# Patient Record
Sex: Male | Born: 1960 | Race: White | Hispanic: No | Marital: Single | Smoking: Former smoker
Health system: Southern US, Community
[De-identification: ages and names within clinical notes are randomized; demographics above are authoritative.]

## PROBLEM LIST (undated history)

## (undated) DIAGNOSIS — H269 Unspecified cataract: Secondary | ICD-10-CM

## (undated) DIAGNOSIS — E785 Hyperlipidemia, unspecified: Secondary | ICD-10-CM

## (undated) DIAGNOSIS — I255 Ischemic cardiomyopathy: Secondary | ICD-10-CM

## (undated) DIAGNOSIS — E119 Type 2 diabetes mellitus without complications: Secondary | ICD-10-CM

## (undated) DIAGNOSIS — I119 Hypertensive heart disease without heart failure: Secondary | ICD-10-CM

## (undated) DIAGNOSIS — I10 Essential (primary) hypertension: Secondary | ICD-10-CM

## (undated) DIAGNOSIS — I2510 Atherosclerotic heart disease of native coronary artery without angina pectoris: Secondary | ICD-10-CM

## (undated) DIAGNOSIS — N529 Male erectile dysfunction, unspecified: Secondary | ICD-10-CM

## (undated) DIAGNOSIS — R011 Cardiac murmur, unspecified: Secondary | ICD-10-CM

## (undated) HISTORY — DX: Type 2 diabetes mellitus without complications: E11.9

## (undated) HISTORY — DX: Atherosclerotic heart disease of native coronary artery without angina pectoris: I25.10

## (undated) HISTORY — DX: Hyperlipidemia, unspecified: E78.5

## (undated) HISTORY — DX: Morbid (severe) obesity due to excess calories: E66.01

## (undated) HISTORY — DX: Unspecified cataract: H26.9

## (undated) HISTORY — DX: Cardiac murmur, unspecified: R01.1

## (undated) HISTORY — DX: Hypertensive heart disease without heart failure: I11.9

## (undated) HISTORY — DX: Ischemic cardiomyopathy: I25.5

## (undated) HISTORY — DX: Essential (primary) hypertension: I10

## (undated) HISTORY — DX: Male erectile dysfunction, unspecified: N52.9

## (undated) HISTORY — PX: GASTROSTOMY W/ FEEDING TUBE: SUR642

---

## 2010-11-28 HISTORY — PX: CARDIAC CATHETERIZATION: SHX172

## 2010-12-20 ENCOUNTER — Inpatient Hospital Stay: Payer: Self-pay | Admitting: Internal Medicine

## 2010-12-20 ENCOUNTER — Encounter: Payer: Self-pay | Admitting: Cardiovascular Disease

## 2010-12-20 DIAGNOSIS — I2510 Atherosclerotic heart disease of native coronary artery without angina pectoris: Secondary | ICD-10-CM

## 2010-12-20 DIAGNOSIS — M279 Disease of jaws, unspecified: Secondary | ICD-10-CM

## 2010-12-21 DIAGNOSIS — I214 Non-ST elevation (NSTEMI) myocardial infarction: Secondary | ICD-10-CM

## 2011-01-18 ENCOUNTER — Ambulatory Visit: Payer: Self-pay | Admitting: Internal Medicine

## 2011-01-21 ENCOUNTER — Telehealth: Payer: Self-pay | Admitting: Internal Medicine

## 2011-01-21 NOTE — Telephone Encounter (Signed)
Spoke with patient. Per patient he is going to call his cardiologist, Dr. Mariah Milling.

## 2011-01-29 ENCOUNTER — Ambulatory Visit: Payer: Self-pay | Admitting: Internal Medicine

## 2011-02-28 ENCOUNTER — Ambulatory Visit: Payer: Self-pay | Admitting: Internal Medicine

## 2011-03-31 ENCOUNTER — Ambulatory Visit: Payer: Self-pay | Admitting: Internal Medicine

## 2011-04-12 ENCOUNTER — Other Ambulatory Visit: Payer: Self-pay | Admitting: Internal Medicine

## 2011-04-13 MED ORDER — METOPROLOL TARTRATE 25 MG PO TABS: 25.0000 mg | ORAL_TABLET | Freq: Two times a day (BID) | ORAL | Status: DC

## 2011-04-13 MED ORDER — METFORMIN HCL 500 MG PO TABS: 500.0000 mg | ORAL_TABLET | Freq: Two times a day (BID) | ORAL | Status: DC

## 2011-04-13 MED ORDER — ATORVASTATIN CALCIUM 40 MG PO TABS: 40.0000 mg | ORAL_TABLET | Freq: Every day | ORAL | Status: DC

## 2011-04-13 MED ORDER — GLIPIZIDE 5 MG PO TABS: 5.0000 mg | ORAL_TABLET | Freq: Two times a day (BID) | ORAL | Status: DC

## 2011-04-13 MED ORDER — CLOPIDOGREL BISULFATE 75 MG PO TABS: 75.0000 mg | ORAL_TABLET | Freq: Every day | ORAL | Status: DC

## 2011-04-13 MED ORDER — ENALAPRIL MALEATE 2.5 MG PO TABS: 2.5000 mg | ORAL_TABLET | Freq: Every day | ORAL | Status: DC

## 2011-04-13 NOTE — Telephone Encounter (Signed)
Ok to refill these 6 meds, but please make sure patient has made an appt to followup within the next 30 days

## 2011-08-14 LAB — HM DIABETES EYE EXAM: HM Diabetic Eye Exam: NORMAL

## 2011-09-08 ENCOUNTER — Encounter: Payer: Self-pay | Admitting: Internal Medicine

## 2011-09-08 ENCOUNTER — Ambulatory Visit (INDEPENDENT_AMBULATORY_CARE_PROVIDER_SITE_OTHER): Payer: BC Managed Care – PPO | Admitting: Internal Medicine

## 2011-09-08 VITALS — BP 134/70 | HR 56 | Temp 98.3°F | Resp 16 | Ht 71.5 in | Wt 252.5 lb

## 2011-09-08 DIAGNOSIS — E119 Type 2 diabetes mellitus without complications: Secondary | ICD-10-CM

## 2011-09-08 DIAGNOSIS — E1165 Type 2 diabetes mellitus with hyperglycemia: Secondary | ICD-10-CM

## 2011-09-08 DIAGNOSIS — E785 Hyperlipidemia, unspecified: Secondary | ICD-10-CM

## 2011-09-08 DIAGNOSIS — Z79899 Other long term (current) drug therapy: Secondary | ICD-10-CM

## 2011-09-08 DIAGNOSIS — E669 Obesity, unspecified: Secondary | ICD-10-CM

## 2011-09-08 DIAGNOSIS — I25118 Atherosclerotic heart disease of native coronary artery with other forms of angina pectoris: Secondary | ICD-10-CM | POA: Insufficient documentation

## 2011-09-08 DIAGNOSIS — I2510 Atherosclerotic heart disease of native coronary artery without angina pectoris: Secondary | ICD-10-CM | POA: Insufficient documentation

## 2011-09-08 DIAGNOSIS — Z125 Encounter for screening for malignant neoplasm of prostate: Secondary | ICD-10-CM

## 2011-09-08 NOTE — Progress Notes (Signed)
Patient ID: Todd Jenkins, male   DOB: [birth year], 51 y.o.   MRN: [id number]   Patient Active Problem List  Diagnoses  . Coronary artery disease  . Type 2 diabetes mellitus, controlled, with renal complications  . Hyperlipidemia LDL goal < 70    Subjective:  CC:   Chief Complaint  Patient presents with  . Follow-up    HPI:   Todd Jenkins a 51 y.o. male who presents for management of diabetes, hyperlipidemia, CAD and tobacco abuse.  He has been seen since Dr. Darrick Huntsman left [facility] and joined [facility] prior to July 2012) and has not had any medical care since then.  He reports fatigue,  Is exercising less , but taking his medications,  He has occasional loose bowel movements but denies incontinence or nausea. Hre is not exercising regularly and not following a carbohydrate restricted diet.    Past Medical History  Diagnosis Date  . Coronary artery disease       . H/O non-ST elevation myocardial infarction (NSTEMI) July 2012    s/p PTCA/ DESstent of 100% occluded LAD    History reviewed. No pertinent past surgical history.       The following portions of the patient's history were reviewed and updated as appropriate: Allergies, current medications, and problem list.    Review of Systems:   12 Pt  review of systems was negative except those addressed in the HPI,     History   Social History  . Marital Status: Single    Spouse Name: N/A    Number of Children: N/A  . Years of Education: N/A   Occupational History  . Not on file.   Social History Main Topics  . Smoking status: Former Smoker    Types: Cigars  . Smokeless tobacco: Never Used  . Alcohol Use: Yes  . Drug Use: No  . Sexually Active: Not on file   Other Topics Concern  . Not on file   Social History Narrative  . No narrative on file    Objective:  BP 134/70  Pulse 56  Temp(Src) 98.3 F (36.8 C) (Oral)  Resp 16  Ht 5' 11.5" (1.816 m)  Wt 252 lb 8 oz  (114.533 kg)  BMI 34.73 kg/m2  SpO2 99%  General appearance: alert, cooperative and appears stated age Ears: normal TM's and external ear canals both ears Throat: lips, mucosa, and tongue normal; teeth and gums normal Neck: no adenopathy, no carotid bruit, supple, symmetrical, trachea midline and thyroid not enlarged, symmetric, no tenderness/mass/nodules Back: symmetric, no curvature. ROM normal. No CVA tenderness. Lungs: clear to auscultation bilaterally Heart: regular rate and rhythm, S1, S2 normal, no murmur, click, rub or gallop Abdomen: soft, non-tender; bowel sounds normal; no masses,  no organomegaly Pulses: 2+ and symmetric Skin: Skin color, texture, turgor normal. No rashes or lesions Lymph nodes: Cervical, supraclavicular, and axillary nodes normal.  Assessment and Plan:  Coronary artery disease S/p DES of 100% occluded LAD July 2012 for NSTEMI.  He has been lost to followup since his admission and will be scheduled as be referred to Dr. Mariah Milling .  Type 2 diabetes mellitus, controlled, with renal complications And he has not had a hemoglobin A1c since before July 2012. He does not check blood sugars regularly. This is her labs have been ordered today. He has been reminded that he needs an annual eye examcoFortunately his hemoglobin A1c is 6.1 today and his LDL is 69 on current medications ,  and he has normal kidney and liver function. We spent considerable  time addressing his obesity concurrent medical diagnoses and need for low glycemic diet.   Hyperlipidemia LDL goal < 70 Well-controlled on Lipitor per current labs. No changes today    Updated Medication List Outpatient Encounter Prescriptions as of 09/08/2011  Medication Sig Dispense Refill  . aspirin 325 MG tablet Take 325 mg by mouth daily.      Marland Kitchen atorvastatin (LIPITOR) 40 MG tablet Take 1 tablet (40 mg total) by mouth daily.  30 tablet  5  . clopidogrel (PLAVIX) 75 MG tablet Take 1 tablet (75 mg total) by mouth  daily.  30 tablet  5  . enalapril (VASOTEC) 2.5 MG tablet Take 1 tablet (2.5 mg total) by mouth daily.  30 tablet  5  . glipiZIDE (GLUCOTROL) 5 MG tablet Take 1 tablet (5 mg total) by mouth 2 (two) times daily.  60 tablet  5  . metFORMIN (GLUCOPHAGE) 500 MG tablet Take 1 tablet (500 mg total) by mouth 2 (two) times daily with a meal.  60 tablet  5  . metoprolol tartrate (LOPRESSOR) 25 MG tablet Take 1 tablet (25 mg total) by mouth 2 (two) times daily.  60 tablet  5     Orders Placed This Encounter  Procedures  . MyChart Weight Flowsheet  . PSA, total and free  . Lipid panel  . COMPLETE METABOLIC PANEL WITH GFR  . Hemoglobin A1c  . Microalbumin / creatinine urine ratio  . CBC with Differential  . Ambulatory referral to Cardiology    Return in about 3 months (around 12/08/2011).       And in an and is

## 2011-09-08 NOTE — Patient Instructions (Signed)
Consider the Low Glycemic Index Diet and 6 smaller meals daily :   7 AM Low carbohydrate Protein  Shakes (EAS Carb Control  Or Atkins ,  Available everywhere,   In  cases at [employer] )  2.5 carbs  (Add or substitute a toasted sandwhich thin w/ peanut butter)  10 AM: Protein bar by Atkins (snack size,  Chocolate lover's variety at  [employer])    Lunch: sandwich on pita bread or flatbread (Joseph's makes a pita bread and a flat bread , available at [employer] and [employer]; Toufayah makes a low carb flatbread available at [employer] and [employer]) [employer] and [employer] make a low carb whole wheat tortilla  3 PM:  Mid day :  Another protein bar,  Or a  cheese stick, 1/4 cup of almonds, walnuts, pistachios, pecans, peanuts,  Macadamia nuts  6 PM  Dinner:  "mean and green:"  Meat/chicken/fish, salad, and green veggie : use ranch, vinagrette,  Blue cheese, etc  9 PM snack : Breyer's low carb fudgiscle or  ice cream bar (Carb Smart) Weight Watcher's ice cream bar , or another protein shake

## 2011-09-09 ENCOUNTER — Other Ambulatory Visit (INDEPENDENT_AMBULATORY_CARE_PROVIDER_SITE_OTHER): Payer: BC Managed Care – PPO | Admitting: *Deleted

## 2011-09-09 DIAGNOSIS — E785 Hyperlipidemia, unspecified: Secondary | ICD-10-CM

## 2011-09-09 DIAGNOSIS — E119 Type 2 diabetes mellitus without complications: Secondary | ICD-10-CM

## 2011-09-09 DIAGNOSIS — Z125 Encounter for screening for malignant neoplasm of prostate: Secondary | ICD-10-CM

## 2011-09-09 DIAGNOSIS — Z79899 Other long term (current) drug therapy: Secondary | ICD-10-CM

## 2011-09-09 LAB — CBC WITH DIFFERENTIAL/PLATELET
Basophils Absolute: 0 10*3/uL (ref 0.0–0.1)
Eosinophils Absolute: 0.2 10*3/uL (ref 0.0–0.7)
MCHC: 34.1 g/dL (ref 30.0–36.0)
MCV: 87.5 fl (ref 78.0–100.0)
Monocytes Absolute: 0.5 10*3/uL (ref 0.1–1.0)
Neutrophils Relative %: 66 % (ref 43.0–77.0)
Platelets: 188 10*3/uL (ref 150.0–400.0)
WBC: 6.6 10*3/uL (ref 4.5–10.5)

## 2011-09-09 LAB — LIPID PANEL: HDL: 47.8 mg/dL (ref >39.00)

## 2011-09-10 LAB — PSA, TOTAL AND FREE
PSA, Free Pct: 28 % (ref >25)
PSA, Free: 0.15 ng/mL
PSA: 0.53 ng/mL (ref <=4.00)

## 2011-09-10 LAB — COMPLETE METABOLIC PANEL WITH GFR
ALT: 28 U/L (ref 0–53)
CO2: 21 mEq/L (ref 19–32)
Calcium: 9.1 mg/dL (ref 8.4–10.5)
Chloride: 107 mEq/L (ref 96–112)
Creat: 0.96 mg/dL (ref 0.50–1.35)
GFR, Est African American: >89 mL/min
Glucose, Bld: 138 mg/dL — ABNORMAL HIGH (ref 70–99)
Total Protein: 6.7 g/dL (ref 6.0–8.3)

## 2011-09-11 ENCOUNTER — Encounter: Payer: Self-pay | Admitting: Internal Medicine

## 2011-09-11 DIAGNOSIS — E1129 Type 2 diabetes mellitus with other diabetic kidney complication: Secondary | ICD-10-CM | POA: Insufficient documentation

## 2011-09-11 DIAGNOSIS — E785 Hyperlipidemia, unspecified: Secondary | ICD-10-CM | POA: Insufficient documentation

## 2011-09-11 NOTE — Assessment & Plan Note (Signed)
S/p DES of 100% occluded LAD July 2012 for NSTEMI.  He has been lost to followup since his admission and will be scheduled as be referred to Dr. Mariah Milling .

## 2011-09-11 NOTE — Assessment & Plan Note (Signed)
Well-controlled on Lipitor per current labs. No changes today

## 2011-09-11 NOTE — Assessment & Plan Note (Addendum)
And he has not had a hemoglobin A1c since before July 2012. He does not check blood sugars regularly. Ad hsi annual diabetic eye exam at [facility] in March 2013. Fortunately his hemoglobin A1c is 6.1 today and his LDL is 69 on current medications , and he has normal kidney and liver function. We spent considerable  time addressing his obesity given his  concurrent medical diagnoses and need for low glycemic diet.

## 2011-10-04 ENCOUNTER — Ambulatory Visit: Payer: BC Managed Care – PPO | Admitting: Cardiovascular Disease

## 2011-10-18 ENCOUNTER — Other Ambulatory Visit: Payer: Self-pay | Admitting: Internal Medicine

## 2011-10-18 MED ORDER — METOPROLOL TARTRATE 25 MG PO TABS: 25.0000 mg | ORAL_TABLET | Freq: Two times a day (BID) | ORAL | Status: DC

## 2011-10-18 MED ORDER — ENALAPRIL MALEATE 2.5 MG PO TABS: 2.5000 mg | ORAL_TABLET | Freq: Every day | ORAL | Status: DC

## 2011-10-18 MED ORDER — GLIPIZIDE 5 MG PO TABS: 5.0000 mg | ORAL_TABLET | Freq: Two times a day (BID) | ORAL | Status: DC

## 2011-10-18 MED ORDER — ATORVASTATIN CALCIUM 40 MG PO TABS: 40.0000 mg | ORAL_TABLET | Freq: Every day | ORAL | Status: DC

## 2011-10-18 MED ORDER — METFORMIN HCL 500 MG PO TABS: 500.0000 mg | ORAL_TABLET | Freq: Two times a day (BID) | ORAL | Status: DC

## 2011-10-18 MED ORDER — CLOPIDOGREL BISULFATE 75 MG PO TABS: 75.0000 mg | ORAL_TABLET | Freq: Every day | ORAL | Status: DC

## 2011-10-20 ENCOUNTER — Encounter: Payer: Self-pay | Admitting: Cardiovascular Disease

## 2011-10-20 ENCOUNTER — Other Ambulatory Visit: Payer: Self-pay | Admitting: Internal Medicine

## 2011-10-20 ENCOUNTER — Ambulatory Visit (INDEPENDENT_AMBULATORY_CARE_PROVIDER_SITE_OTHER): Payer: BC Managed Care – PPO | Admitting: Cardiovascular Disease

## 2011-10-20 VITALS — BP 148/80 | HR 74 | Ht 71.0 in | Wt 251.0 lb

## 2011-10-20 DIAGNOSIS — E785 Hyperlipidemia, unspecified: Secondary | ICD-10-CM

## 2011-10-20 DIAGNOSIS — E118 Type 2 diabetes mellitus with unspecified complications: Secondary | ICD-10-CM

## 2011-10-20 DIAGNOSIS — I2510 Atherosclerotic heart disease of native coronary artery without angina pectoris: Secondary | ICD-10-CM

## 2011-10-20 MED ORDER — ENALAPRIL MALEATE 2.5 MG PO TABS: 2.5000 mg | ORAL_TABLET | Freq: Two times a day (BID) | ORAL | Status: DC

## 2011-10-20 MED ORDER — GLIPIZIDE 5 MG PO TABS: 5.0000 mg | ORAL_TABLET | Freq: Two times a day (BID) | ORAL | Status: DC

## 2011-10-20 NOTE — Patient Instructions (Signed)
You are doing well. Please increase enalapril to 2.5 mg twice a day decrease the aspirin to 81 mg x 2 with plavix  Please call us if you have new issues that need to be addressed before your next appt.  Your physician wants you to follow-up in: 6 months.  You will receive a reminder letter in the mail two months in advance. If you don't receive a letter, please call our office to schedule the follow-up appointment.

## 2011-10-20 NOTE — Assessment & Plan Note (Signed)
Cholesterol is at goal on the current lipid regimen. No changes to the medications were made.  

## 2011-10-20 NOTE — Assessment & Plan Note (Signed)
We have encouraged continued exercise, careful diet management in an effort to lose weight. Hemoglobin A1c is well-controlled.

## 2011-10-20 NOTE — Progress Notes (Signed)
Patient ID: Todd Jenkins, male    DOB: 02-04-1961, 51 y.o.   MRN: [id number]  HPI Comments: Todd Jenkins is a 51 y.o. male with a history of diabetes, hyperlipidemia, CAD and tobacco abuse.  H/O non-ST elevation myocardial infarction (NSTEMI)  July 2012   s/p PTCA/ DESstent of 100% occluded LAD. He was lost to followup over the course of the past year and presents today to establish care in the office. Previous notes indicated moderate to heavy alcohol use. He reports having 2 drinks per day currently  He reports that he's been feeling well. No chest pain, jaw pain, diaphoresis. Reasonable exercise tolerance. No significant shortness of breath with exertion. He has been tolerating his medications without any difficulty.  Previous echocardiogram July 2012 showed ejection fraction 40-45%, moderate anterior wall hypokinesis, apical wall hypokinesis consistent with old anterior MI  Cardiac catheterization showed 99% mid LAD disease after the B1 vessel, ejection fraction 30-35%. Successful  xience 3.0 x 18 mm DES stent placed  EKG shows sinus bradycardia with rate 54 beats per minute with old anterior septal MI     Outpatient Encounter Prescriptions as of 10/20/2011  Medication Sig Dispense Refill  . aspirin 325 MG tablet Take 325 mg by mouth daily.      Marland Kitchen atorvastatin (LIPITOR) 40 MG tablet Take 1 tablet (40 mg total) by mouth daily.  30 tablet  5  . clopidogrel (PLAVIX) 75 MG tablet Take 1 tablet (75 mg total) by mouth daily.  30 tablet  5  . enalapril (VASOTEC) 2.5 MG tablet Take 1 tablet (2.5 mg total) by mouth 2 (two) times daily.  60 tablet  6  . metFORMIN (GLUCOPHAGE) 500 MG tablet Take 1 tablet (500 mg total) by mouth 2 (two) times daily with a meal.  60 tablet  5  . metoprolol tartrate (LOPRESSOR) 25 MG tablet Take 1 tablet (25 mg total) by mouth 2 (two) times daily.  60 tablet  5   Review of Systems  Constitutional: Negative.   HENT: Negative.   Eyes: Negative.    Respiratory: Negative.   Cardiovascular: Negative.   Gastrointestinal: Negative.   Musculoskeletal: Negative.   Skin: Negative.   Neurological: Negative.   Hematological: Negative.   Psychiatric/Behavioral: Negative.   All other systems reviewed and are negative.    BP 148/80  Pulse 74  Ht 5\' 11"  (1.803 m)  Wt 251 lb (113.853 kg)  BMI 35.01 kg/m2  Physical Exam  Nursing note and vitals reviewed. Constitutional: He is oriented to person, place, and time. He appears well-developed and well-nourished.  HENT:  Head: Normocephalic.  Nose: Nose normal.  Mouth/Throat: Oropharynx is clear and moist.  Eyes: Conjunctivae are normal. Pupils are equal, round, and reactive to light.  Neck: Normal range of motion. Neck supple. No JVD present.  Cardiovascular: Normal rate, regular rhythm, S1 normal, S2 normal, normal heart sounds and intact distal pulses.  Exam reveals no gallop and no friction rub.   No murmur heard. Pulmonary/Chest: Effort normal and breath sounds normal. No respiratory distress. He has no wheezes. He has no rales. He exhibits no tenderness.  Abdominal: Soft. Bowel sounds are normal. He exhibits no distension. There is no tenderness.  Musculoskeletal: Normal range of motion. He exhibits no edema and no tenderness.  Lymphadenopathy:    He has no cervical adenopathy.  Neurological: He is alert and oriented to person, place, and time. Coordination normal.  Skin: Skin is warm and dry. No rash noted. No  erythema.  Psychiatric: He has a normal mood and affect. His behavior is normal. Judgment and thought content normal.           Assessment and Plan

## 2011-10-20 NOTE — Assessment & Plan Note (Signed)
Currently with no symptoms of angina. No further workup at this time. Continue current medication regimen. 

## 2011-12-08 ENCOUNTER — Ambulatory Visit (INDEPENDENT_AMBULATORY_CARE_PROVIDER_SITE_OTHER): Payer: BC Managed Care – PPO | Admitting: Internal Medicine

## 2011-12-08 ENCOUNTER — Encounter: Payer: Self-pay | Admitting: Internal Medicine

## 2011-12-08 VITALS — BP 120/70 | HR 60 | Temp 98.5°F | Resp 16 | Ht 71.5 in | Wt 254.8 lb

## 2011-12-08 DIAGNOSIS — E119 Type 2 diabetes mellitus without complications: Secondary | ICD-10-CM

## 2011-12-08 DIAGNOSIS — R5381 Other malaise: Secondary | ICD-10-CM

## 2011-12-08 DIAGNOSIS — L989 Disorder of the skin and subcutaneous tissue, unspecified: Secondary | ICD-10-CM

## 2011-12-08 DIAGNOSIS — E669 Obesity, unspecified: Secondary | ICD-10-CM

## 2011-12-08 DIAGNOSIS — R5383 Other fatigue: Secondary | ICD-10-CM

## 2011-12-08 DIAGNOSIS — J069 Acute upper respiratory infection, unspecified: Secondary | ICD-10-CM

## 2011-12-08 DIAGNOSIS — Z0000 Encounter for general adult medical examination without abnormal findings: Secondary | ICD-10-CM

## 2011-12-08 DIAGNOSIS — Z125 Encounter for screening for malignant neoplasm of prostate: Secondary | ICD-10-CM

## 2011-12-08 DIAGNOSIS — E118 Type 2 diabetes mellitus with unspecified complications: Secondary | ICD-10-CM

## 2011-12-08 LAB — HEMOGLOBIN A1C: Hgb A1c MFr Bld: 6.5 % (ref 4.6–6.5)

## 2011-12-08 LAB — POCT URINALYSIS DIPSTICK
Blood, UA: NEGATIVE
Glucose, UA: 100
Leukocytes, UA: NEGATIVE
Nitrite, UA: NEGATIVE
Urobilinogen, UA: 0.2
pH, UA: 5.5

## 2011-12-08 LAB — COMPLETE METABOLIC PANEL WITH GFR
AST: 21 U/L (ref 0–37)
Albumin: 3.9 g/dL (ref 3.5–5.2)
BUN: 18 mg/dL (ref 6–23)
CO2: 24 mEq/L (ref 19–32)
Calcium: 9.2 mg/dL (ref 8.4–10.5)
Chloride: 106 mEq/L (ref 96–112)
Creat: 0.93 mg/dL (ref 0.50–1.35)
GFR, Est African American: >89 mL/min
Glucose, Bld: 142 mg/dL — ABNORMAL HIGH (ref 70–99)
Potassium: 5 mEq/L (ref 3.5–5.3)

## 2011-12-08 LAB — TSH: TSH: 2.42 u[IU]/mL (ref 0.35–5.50)

## 2011-12-08 LAB — MICROALBUMIN / CREATININE URINE RATIO: Microalb Creat Ratio: 1.2 mg/g (ref 0.0–30.0)

## 2011-12-08 LAB — PSA: PSA: 0.49 ng/mL (ref 0.10–4.00)

## 2011-12-08 MED ORDER — AMOXICILLIN-POT CLAVULANATE 875-125 MG PO TABS: 1.0000 | ORAL_TABLET | Freq: Two times a day (BID) | ORAL | Status: AC

## 2011-12-08 NOTE — Patient Instructions (Signed)
gargle with salt water  Snort Simply saline twice daily for a few days to clear the post nasal drip     augmentin twice daily for 7 days

## 2011-12-08 NOTE — Assessment & Plan Note (Addendum)
With CAD. Well controlled,  hgba1c 6.5  Up to date on annual eye exam with Dr Karren Cobble office.  Foot exam normal today . No proteinuria . LDL 69 by April labs

## 2011-12-08 NOTE — Assessment & Plan Note (Signed)
Annual exam done today including skin check.  One lesion on left posterior shoulder is concernign for skin CA and will be referred to Dr. Adolphus Birchwood for biopsy.

## 2011-12-08 NOTE — Progress Notes (Signed)
Patient ID: Todd Jenkins, male   DOB: 1960-09-29, 51 y.o.   MRN: [id number]  Patient Active Problem List  Diagnosis  . Coronary artery disease  . Diabetes mellitus type 2 with complications  . Hyperlipidemia LDL goal < 70  . URI (upper respiratory infection)  . Fatigue  . Special screening for malignant neoplasm of prostate  . Routine general medical examination at a health care facility  . Skin lesion of left arm  . Obesity (BMI 30-39.9)    Subjective:  CC:   Chief Complaint  Patient presents with  . Annual Exam    HPI:   Todd Jenkins a 51 y.o. male who presents for his annual exam, but has a new complaint of feeling foggy headed on a daily basis.  His symptoms are aggravated by hunger, but he has found no evidence of hypoglycemia by accu checks done at home. He has recently increased his metoprolol to twice daily after  Dr. Mariah Milling noted incorrect use (he swears that the directions on his bottles previously directed once daily use but the rx in [facility] since November states twice daily...)   He does snore, and typically feels tired in the morning but is not  falling asleep during the day. He denies headaches, vertigo and impotence.  No unintentional wt loss or wt gain.    Past Medical History  Diagnosis Date  . Coronary artery disease   . H/O non-ST elevation myocardial infarction (NSTEMI) July 2012    s/p PTCA/ DESstent of 100% occluded LAD  . Diabetes mellitus     type II    Past Surgical History  Procedure Date  . Cardiac catheterization July 2012  . 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    Review of Systems:   Review of Systems  Constitutional: Positive for malaise/fatigue. Negative for fever and diaphoresis.  HENT: Negative.  Negative for ear pain and congestion.    Eyes: Negative.   Cardiovascular: Negative for chest pain, orthopnea and PND.  Gastrointestinal: Positive for abdominal pain. Negative for heartburn, nausea and blood in stool.  Genitourinary: Negative for dysuria and flank pain.  Musculoskeletal: Negative for myalgias.  Skin: Negative.   Neurological: Negative for dizziness, sensory change, focal weakness, loss of consciousness and headaches.  Psychiatric/Behavioral: Negative for memory loss. The patient does not have insomnia.         History   Social History  . Marital Status: Single    Spouse Name: N/A    Number of Children: N/A  . Years of Education: N/A   Occupational History  . Not on file.   Social History Main Topics  . Smoking status: Former Smoker -- 8 years    Types: Cigars  . Smokeless tobacco: Never Used  . Alcohol Use: No  . Drug Use: No  . Sexually Active: Not on file   Other Topics Concern  . Not on file   Social History Narrative  . No narrative on file    Objective:  BP 120/70  Pulse 60  Temp 98.5 F (36.9 C) (Oral)  Resp 16  Ht 5' 11.5" (1.816 m)  Wt 254 lb 12 oz (115.554 kg)  BMI 35.04 kg/m2  SpO2 96%  General Appearance:    Obese, Alert, cooperative, no distress,  appears stated age  Head:    Normocephalic, without obvious abnormality, atraumatic  Eyes:    PERRL, conjunctiva/corneas clear, EOM's intact, fundi    benign, both eyes       Ears:    Normal TM's and external ear canals, both ears  Nose:   Nares normal, septum midline, mucosa normal, no drainage   or sinus tenderness  Throat:   Lips, mucosa, and tongue normal; teeth and gums normal  Neck:   Supple, symmetrical, trachea midline, no adenopathy;       thyroid:  No enlargement/tenderness/nodules; no carotid   bruit or JVD  Back:     Symmetric, no curvature, ROM normal, no CVA tenderness  Lungs:     Clear to auscultation bilaterally, respirations unlabored  Chest wall:    No tenderness or deformity  Heart:    Regular rate  and rhythm, S1 and S2 normal, no murmur, rub   or gallop  Abdomen:     Soft, non-tender, bowel sounds active all four quadrants,    no masses, no organomegaly  Genitalia:    Normal male without lesion, discharge or tenderness  Rectal:    Normal tone, normal prostate, no masses or tenderness;   guaiac negative stool  Extremities:   Extremities normal, atraumatic, no cyanosis or edema  Pulses:   2+ and symmetric all extremities  Skin:   Skin color, texture, turgor normal except for left shoulder. Scaling scabbed papule.   Lymph nodes:   Cervical, supraclavicular, and axillary nodes normal  Neurologic:   CNII-XII intact. Normal strength, sensation and reflexes      throughout      Assessment and Plan:  Diabetes mellitus type 2 with complications With CAD. Well controlled,  hgba1c 6.5  Up to date on annual eye exam with Dr Karren Cobble office.  Foot exam normal today . No proteinuria . LDL 69 by April labs   Fatigue Etiology unclear. With altered concentration suggesting metoprolol effect, low T or untreated sleep apnea. If T is normal will decrease metoprolol dose to 12.5 bid ; if no change, order sleep study.   Special screening for malignant neoplasm of prostate DRE was normal.  PSA dawn prior to DRE   Routine general medical examination at a health care facility Annual exam done today including skin check.  One lesion on left posterior shoulder is concernign for skin CA and will be referred to Dr. Adolphus Birchwood for biopsy.   Skin lesion of left arm Posterior left upper arm/shoulder area,  Present for 6 months per patient, bleeds, scabs and rebleeds, concerning for skin Ca.  Refer to Dermatology   Obesity (BMI 30-39.9) I have addressed  BMI and recommended a low glycemic index diet utilizing smaller more frequent meals to increase metabolism.  I have also recommended that patient start exercising with a goal of 30 minutes of aerobic exercise a minimum of 5 days per week. Screening for diabetes  control to be done today.     Updated Medication List Outpatient Encounter Prescriptions as of 12/08/2011  Medication Sig Dispense Refill  . aspirin 325 MG tablet Take 325 mg by mouth daily.      Marland Kitchen atorvastatin (LIPITOR) 40 MG tablet Take 1 tablet (40 mg total) by mouth daily.  30 tablet  5  . clopidogrel (PLAVIX) 75 MG tablet Take 1 tablet (75 mg total) by mouth daily.  30 tablet  5  . enalapril (VASOTEC) 2.5 MG tablet Take 1 tablet (2.5 mg total) by mouth 2 (  two) times daily.  60 tablet  6  . glipiZIDE (GLUCOTROL) 5 MG tablet Take 1 tablet (5 mg total) by mouth 2 (two) times daily.  60 tablet  5  . metFORMIN (GLUCOPHAGE) 500 MG tablet Take 1 tablet (500 mg total) by mouth 2 (two) times daily with a meal.  60 tablet  5  . metoprolol tartrate (LOPRESSOR) 25 MG tablet Take 1 tablet (25 mg total) by mouth 2 (two) times daily.  60 tablet  5  . amoxicillin-clavulanate (AUGMENTIN) 875-125 MG per tablet Take 1 tablet by mouth 2 (two) times daily.  14 tablet  0     Orders Placed This Encounter  Procedures  . COMPLETE METABOLIC PANEL WITH GFR  . TSH  . Hemoglobin A1c  . Microalbumin / creatinine urine ratio  . Testosterone, free, total  . PSA  . Ambulatory referral to Dermatology  . POCT Urinalysis Dipstick  . HM COLONOSCOPY    Return in about 3 months (around 03/09/2012).

## 2011-12-08 NOTE — Assessment & Plan Note (Signed)
DRE was normal.  PSA dawn prior to DRE

## 2011-12-08 NOTE — Assessment & Plan Note (Signed)
Posterior left upper arm/shoulder area,  Present for 6 months per patient, bleeds, scabs and rebleeds, concerning for skin Ca.  Refer to Dermatology

## 2011-12-08 NOTE — Assessment & Plan Note (Signed)
Etiology unclear. With altered concentration suggesting metoprolol effect, low T or untreated sleep apnea. If T is normal will decrease metoprolol dose to 12.5 bid ; if no change, order sleep study.

## 2011-12-08 NOTE — Assessment & Plan Note (Addendum)
I have addressed  BMI and recommended a low glycemic index diet utilizing smaller more frequent meals to increase metabolism.  I have also recommended that patient start exercising with a goal of 30 minutes of aerobic exercise a minimum of 5 days per week. Screening for diabetes control to be done today.

## 2011-12-09 LAB — TESTOSTERONE, FREE, TOTAL, SHBG
Testosterone, Free: 54.4 pg/mL (ref 47.0–244.0)
Testosterone-% Free: 2.1 % (ref 1.6–2.9)

## 2011-12-30 ENCOUNTER — Encounter: Payer: Self-pay | Admitting: Internal Medicine

## 2012-02-19 IMAGING — CR DG CHEST 1V PORT
1 series · 1 of 1 positions shown · non-contrast
Comparison: none

REASON FOR EXAM: chest pain
COMMENTS:

PROCEDURE:     DXR - DXR PORTABLE CHEST SINGLE VIEW  - December 20, 2010  [DATE]
RESULT:     The lung fields are clear.  The heart, mediastinal and osseous
structures show no significant abnormalities. Monitoring electrodes are
present.

[view not recorded]
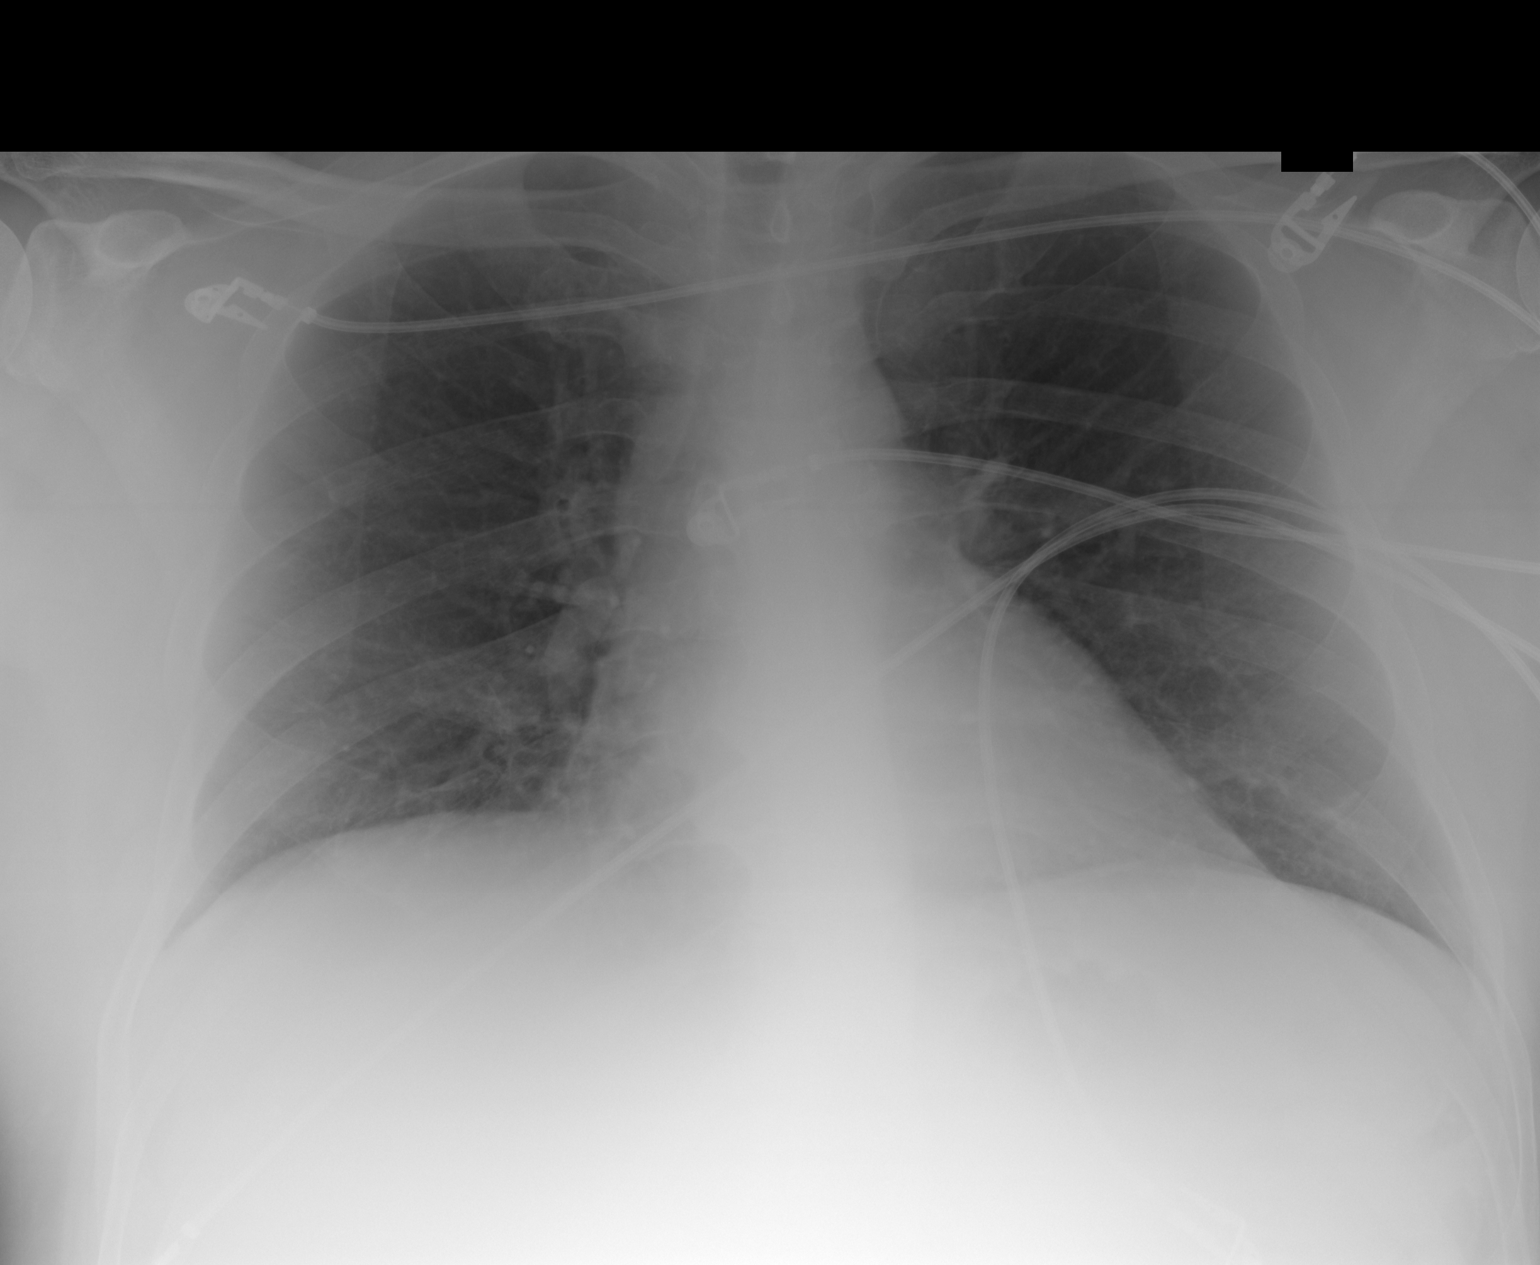

[1 of 1 positions shown; findings below may reference images not displayed]

IMPRESSION: No acute changes are identified.

## 2012-03-09 ENCOUNTER — Ambulatory Visit: Payer: BC Managed Care – PPO | Admitting: Internal Medicine

## 2012-03-12 ENCOUNTER — Encounter: Payer: Self-pay | Admitting: Internal Medicine

## 2012-03-12 ENCOUNTER — Ambulatory Visit (INDEPENDENT_AMBULATORY_CARE_PROVIDER_SITE_OTHER): Payer: BC Managed Care – PPO | Admitting: Internal Medicine

## 2012-03-12 VITALS — BP 118/64 | HR 74 | Temp 98.0°F | Ht 70.5 in | Wt 258.5 lb

## 2012-03-12 DIAGNOSIS — I2510 Atherosclerotic heart disease of native coronary artery without angina pectoris: Secondary | ICD-10-CM

## 2012-03-12 DIAGNOSIS — E118 Type 2 diabetes mellitus with unspecified complications: Secondary | ICD-10-CM

## 2012-03-12 DIAGNOSIS — E785 Hyperlipidemia, unspecified: Secondary | ICD-10-CM

## 2012-03-12 DIAGNOSIS — Z23 Encounter for immunization: Secondary | ICD-10-CM

## 2012-03-12 DIAGNOSIS — L989 Disorder of the skin and subcutaneous tissue, unspecified: Secondary | ICD-10-CM

## 2012-03-12 DIAGNOSIS — E119 Type 2 diabetes mellitus without complications: Secondary | ICD-10-CM

## 2012-03-12 LAB — LIPID PANEL
Cholesterol: 153 mg/dL (ref 0–200)
LDL Cholesterol: 69 mg/dL (ref 0–99)
Total CHOL/HDL Ratio: 3

## 2012-03-12 LAB — COMPREHENSIVE METABOLIC PANEL
ALT: 44 U/L (ref 0–53)
AST: 25 U/L (ref 0–37)
Albumin: 3.7 g/dL (ref 3.5–5.2)
CO2: 26 mEq/L (ref 19–32)
Calcium: 8.9 mg/dL (ref 8.4–10.5)
Chloride: 106 mEq/L (ref 96–112)
Potassium: 4.2 mEq/L (ref 3.5–5.1)

## 2012-03-12 MED ORDER — METOPROLOL SUCCINATE ER 25 MG PO TB24: 12.5000 mg | ORAL_TABLET | Freq: Every day | ORAL | Status: DC

## 2012-03-12 NOTE — Patient Instructions (Signed)
We are changing the metoprolol to the once daily formula,,  And reducing the dose to 1/2 tablet in the evening .  To see if the fogginess improves.

## 2012-03-12 NOTE — Progress Notes (Signed)
Patient ID: Todd Jenkins, male   DOB: 05/21/1961, 51 y.o.   MRN: [id number] Patient Active Problem List  Diagnosis  . Coronary artery disease  . Diabetes mellitus type 2 with complications  . Hyperlipidemia LDL goal < 70  . Fatigue  . Special screening for malignant neoplasm of prostate  . Routine general medical examination at a health care facility  . Skin lesion of left arm  . Obesity (BMI 30-39.9)    Subjective:  CC:   Chief Complaint  Patient presents with  . Follow-up    HPI:   Kweli Grassel Sanbornis a 51 y.o. male who presents3 month follow up on DM, hyperlipidemia, CAD.  He saw Dr. Adolphus Birchwood for evaluation of of several lesions.  The biopsy of left shoulder lesion was done but no path report is available. He has no new issues.. Did not reduce the metoprolol dose as recommended at last visit to improve his mental fogginess.  His testosterone level was low but  he does not want to start testosterone injections.  Not exercising or dieting,  Has gained 4 lbs. He is up to date on eye exams.     Past Medical History  Diagnosis Date  . Coronary artery disease   . H/O non-ST elevation myocardial infarction (NSTEMI) July 2012    s/p PTCA/ DESstent of 100% occluded LAD  . Diabetes mellitus     type II    Past Surgical History  Procedure Date  . Cardiac catheterization July 2012  . 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    Review of Systems:   12 Pt  review of systems was negative except those addressed in the HPI,     History   Social History  . Marital Status: Single    Spouse Name: N/A    Number of Children: N/A  . Years of Education: N/A   Occupational History  . Not on file.   Social History Main Topics  . Smoking status: Former Smoker -- 8 years    Types: Cigars    . Smokeless tobacco: Never Used  . Alcohol Use: No  . Drug Use: No  . Sexually Active: Not on file   Other Topics Concern  . Not on file   Social History Narrative  . No narrative on file    Objective:  BP 118/64  Pulse 74  Temp 98 F (36.7 C) (Oral)  Ht 5' 10.5" (1.791 m)  Wt 258 lb 8 oz (117.255 kg)  BMI 36.57 kg/m2  SpO2 96%  General appearance: alert, cooperative and appears stated age Ears: normal TM's and external ear canals both ears Throat: lips, mucosa, and tongue normal; teeth and gums normal Neck: no adenopathy, no carotid bruit, supple, symmetrical, trachea midline and thyroid not enlarged, symmetric, no tenderness/mass/nodules Back: symmetric, no curvature. ROM normal. No CVA tenderness. Lungs: clear to auscultation bilaterally Heart: regular rate and rhythm, S1, S2 normal, no murmur, click, rub or gallop Abdomen: soft, non-tender; bowel sounds normal; no masses,  no organomegaly Pulses: 2+ and symmetric Skin: Skin color, texture, turgor normal. No rashes or lesions Lymph nodes: Cervical, supraclavicular, and axillary nodes normal.  Assessment and Plan:  Diabetes mellitus type 2 with complications Controlled with minimal medications,  hgb1c is 6.9.  No changes  to medications.  Low GI diet reviewed up to date on eye exams and foot exams  .   Hyperlipidemia LDL goal < 70 Well controlled on current medications.  No changes today.  Skin lesion of left arm Pathology report requested from Dr. Adolphus Birchwood.   Coronary artery disease Currently asymptomatic.      Updated Medication List Outpatient Encounter Prescriptions as of 03/12/2012  Medication Sig Dispense Refill  . aspirin 325 MG tablet Take 325 mg by mouth daily.      Marland Kitchen atorvastatin (LIPITOR) 40 MG tablet Take 1 tablet (40 mg total) by mouth daily.  30 tablet  5  . clopidogrel (PLAVIX) 75 MG tablet Take 1 tablet (75 mg total) by mouth daily.  30 tablet  5  . enalapril (VASOTEC) 2.5 MG tablet Take 1  tablet (2.5 mg total) by mouth 2 (two) times daily.  60 tablet  6  . glipiZIDE (GLUCOTROL) 5 MG tablet Take 1 tablet (5 mg total) by mouth 2 (two) times daily.  60 tablet  5  . metFORMIN (GLUCOPHAGE) 500 MG tablet Take 1 tablet (500 mg total) by mouth 2 (two) times daily with a meal.  60 tablet  5  . DISCONTD: metoprolol tartrate (LOPRESSOR) 25 MG tablet Take 1 tablet (25 mg total) by mouth 2 (two) times daily.  60 tablet  5  . metoprolol succinate (TOPROL-XL) 25 MG 24 hr tablet Take 0.5 tablets (12.5 mg total) by mouth daily.  90 tablet  3     Orders Placed This Encounter  Procedures  . Tdap vaccine greater than or equal to 7yo IM  . Lipid panel  . Comprehensive metabolic panel  . Hemoglobin A1c    Return in about 3 months (around 06/12/2012).

## 2012-03-13 ENCOUNTER — Telehealth: Payer: Self-pay | Admitting: Internal Medicine

## 2012-03-13 ENCOUNTER — Encounter: Payer: Self-pay | Admitting: Internal Medicine

## 2012-03-13 NOTE — Telephone Encounter (Signed)
Received biopsy results from Dr. Adolphus Birchwood.  He biopsied the shoulder but did not give me a path reports.  Please request path report [phone],   Fax is [phone].  Note says patient was to call his office in 2 weeks to get results. But did not call.

## 2012-03-13 NOTE — Assessment & Plan Note (Signed)
Well controlled on current medications.  No changes today. 

## 2012-03-13 NOTE — Assessment & Plan Note (Signed)
Controlled with minimal medications,  hgb1c is 6.9.  No changes to medications.  Low GI diet reviewed up to date on eye exams and foot exams  .

## 2012-03-13 NOTE — Assessment & Plan Note (Signed)
Pathology report requested from Dr. Adolphus Birchwood.

## 2012-03-13 NOTE — Assessment & Plan Note (Signed)
Currently asymptomatic 

## 2012-03-16 NOTE — Telephone Encounter (Signed)
Called Dr.Dash office could not get through, left a message to call me back regarding path report, also faxed a release for path report to [phone].

## 2012-03-25 ENCOUNTER — Telehealth: Payer: Self-pay | Admitting: Internal Medicine

## 2012-03-25 NOTE — Telephone Encounter (Signed)
I received a copy of the pathology report from the biopsy he had of his left shoulder on September 3 by Dr. Adolphus Birchwood. The biopsy was benign. Interestingly,  Dr. Durene Cal notes indicate  that he telephoned you on September 6 to let you know this.

## 2012-03-26 NOTE — Telephone Encounter (Signed)
Left message on patient vm letting him know results of biopsy of shoulder.

## 2012-04-05 ENCOUNTER — Encounter: Payer: Self-pay | Admitting: Internal Medicine

## 2012-04-06 ENCOUNTER — Other Ambulatory Visit: Payer: Self-pay | Admitting: *Deleted

## 2012-04-06 MED ORDER — GLIPIZIDE 5 MG PO TABS: 5.0000 mg | ORAL_TABLET | Freq: Two times a day (BID) | ORAL | Status: DC

## 2012-04-06 MED ORDER — METOPROLOL SUCCINATE ER 25 MG PO TB24: 12.5000 mg | ORAL_TABLET | Freq: Every day | ORAL | Status: DC

## 2012-04-06 MED ORDER — ATORVASTATIN CALCIUM 40 MG PO TABS: 40.0000 mg | ORAL_TABLET | Freq: Every day | ORAL | Status: DC

## 2012-04-06 MED ORDER — METFORMIN HCL 500 MG PO TABS: 500.0000 mg | ORAL_TABLET | Freq: Two times a day (BID) | ORAL | Status: DC

## 2012-04-06 MED ORDER — ENALAPRIL MALEATE 2.5 MG PO TABS: 2.5000 mg | ORAL_TABLET | Freq: Two times a day (BID) | ORAL | Status: DC

## 2012-04-06 MED ORDER — CLOPIDOGREL BISULFATE 75 MG PO TABS: 75.0000 mg | ORAL_TABLET | Freq: Every day | ORAL | Status: DC

## 2012-04-11 ENCOUNTER — Other Ambulatory Visit: Payer: Self-pay

## 2012-04-17 ENCOUNTER — Ambulatory Visit: Payer: BC Managed Care – PPO | Admitting: Cardiovascular Disease

## 2012-04-23 ENCOUNTER — Ambulatory Visit (INDEPENDENT_AMBULATORY_CARE_PROVIDER_SITE_OTHER): Payer: BC Managed Care – PPO | Admitting: Cardiovascular Disease

## 2012-04-23 ENCOUNTER — Encounter: Payer: Self-pay | Admitting: Cardiovascular Disease

## 2012-04-23 VITALS — BP 114/80 | HR 57 | Ht 71.0 in | Wt 259.0 lb

## 2012-04-23 DIAGNOSIS — I2510 Atherosclerotic heart disease of native coronary artery without angina pectoris: Secondary | ICD-10-CM

## 2012-04-23 DIAGNOSIS — E118 Type 2 diabetes mellitus with unspecified complications: Secondary | ICD-10-CM

## 2012-04-23 DIAGNOSIS — E785 Hyperlipidemia, unspecified: Secondary | ICD-10-CM

## 2012-04-23 NOTE — Patient Instructions (Addendum)
You are doing well. Please decrease the aspirin to 81 mg x 2 with plavix  Please call us if you have new issues that need to be addressed before your next appt.  Your physician wants you to follow-up in: 12 months.  You will receive a reminder letter in the mail two months in advance. If you don't receive a letter, please call our office to schedule the follow-up appointment.

## 2012-04-23 NOTE — Progress Notes (Signed)
Patient ID: Todd Jenkins, male    DOB: Dec 13, 1960, 51 y.o.   MRN: [id number]  HPI Comments: Todd Jenkins is a 51 y.o. male with a history of diabetes, hyperlipidemia, CAD and tobacco abuse.  H/O non-ST elevation myocardial infarction (NSTEMI)  July 2012   s/p PTCA/ DESstent of 100% occluded LAD, previous moderate to heavy alcohol use, who presents for routine followup. He smokes occasional cigars.  He reports that he's been feeling well. No chest pain, jaw pain, diaphoresis. Reasonable exercise tolerance. No significant shortness of breath with exertion. He has been tolerating his medications without any difficulty.  Previous echocardiogram July 2012 showed ejection fraction 40-45%, moderate anterior wall hypokinesis, apical wall hypokinesis consistent with old anterior MI.Note echocardiogram done since his prior MI  Cardiac catheterization showed 99% mid LAD disease after the B1 vessel, ejection fraction 30-35%. Successful  xience 3.0 x 18 mm DES stent placed  Total cholesterol 153, LDL 69  EKG shows sinus bradycardia with rate 57 beats per minute with old anterior septal MI     Outpatient Encounter Prescriptions as of 04/23/2012  Medication Sig Dispense Refill  . aspirin 325 MG tablet Take 325 mg by mouth daily.      Marland Kitchen atorvastatin (LIPITOR) 40 MG tablet Take 1 tablet (40 mg total) by mouth daily.  30 tablet  6  . clopidogrel (PLAVIX) 75 MG tablet Take 1 tablet (75 mg total) by mouth daily.  30 tablet  6  . enalapril (VASOTEC) 2.5 MG tablet Take 1 tablet (2.5 mg total) by mouth 2 (two) times daily.  60 tablet  6  . glipiZIDE (GLUCOTROL) 5 MG tablet Take 1 tablet (5 mg total) by mouth 2 (two) times daily.  60 tablet  6  . metFORMIN (GLUCOPHAGE) 500 MG tablet Take 1 tablet (500 mg total) by mouth 2 (two) times daily with a meal.  60 tablet  6  . metoprolol succinate (TOPROL-XL) 25 MG 24 hr tablet Take 0.5 tablets (12.5 mg total) by mouth daily.  30 tablet  6    Review of  Systems  Constitutional: Negative.   HENT: Negative.   Eyes: Negative.   Respiratory: Negative.   Cardiovascular: Negative.   Gastrointestinal: Negative.   Musculoskeletal: Negative.   Skin: Negative.   Neurological: Negative.   Hematological: Negative.   Psychiatric/Behavioral: Negative.   All other systems reviewed and are negative.    BP 114/80  Pulse 57  Ht 5\' 11"  (1.803 m)  Wt 259 lb (117.482 kg)  BMI 36.12 kg/m2  Physical Exam  Nursing note and vitals reviewed. Constitutional: He is oriented to person, place, and time. He appears well-developed and well-nourished.  HENT:  Head: Normocephalic.  Nose: Nose normal.  Mouth/Throat: Oropharynx is clear and moist.  Eyes: Conjunctivae normal are normal. Pupils are equal, round, and reactive to light.  Neck: Normal range of motion. Neck supple. No JVD present.  Cardiovascular: Normal rate, regular rhythm, S1 normal, S2 normal, normal heart sounds and intact distal pulses.  Exam reveals no gallop and no friction rub.   No murmur heard. Pulmonary/Chest: Effort normal and breath sounds normal. No respiratory distress. He has no wheezes. He has no rales. He exhibits no tenderness.  Abdominal: Soft. Bowel sounds are normal. He exhibits no distension. There is no tenderness.  Musculoskeletal: Normal range of motion. He exhibits no edema and no tenderness.  Lymphadenopathy:    He has no cervical adenopathy.  Neurological: He is alert and oriented to person, place,  and time. Coordination normal.  Skin: Skin is warm and dry. No rash noted. No erythema.  Psychiatric: He has a normal mood and affect. His behavior is normal. Judgment and thought content normal.           Assessment and Plan

## 2012-04-23 NOTE — Assessment & Plan Note (Signed)
Currently with no symptoms of angina. No further workup at this time. Continue current medication regimen. 

## 2012-04-23 NOTE — Assessment & Plan Note (Signed)
Cholesterol is at goal on the current lipid regimen. No changes to the medications were made.  

## 2012-04-23 NOTE — Assessment & Plan Note (Signed)
We have encouraged continued exercise, careful diet management in an effort to lose weight. 

## 2012-06-15 ENCOUNTER — Ambulatory Visit (INDEPENDENT_AMBULATORY_CARE_PROVIDER_SITE_OTHER): Payer: BC Managed Care – PPO | Admitting: Internal Medicine

## 2012-06-15 ENCOUNTER — Encounter: Payer: Self-pay | Admitting: Internal Medicine

## 2012-06-15 VITALS — BP 108/82 | HR 58 | Temp 98.2°F | Resp 16 | Wt 265.0 lb

## 2012-06-15 DIAGNOSIS — E118 Type 2 diabetes mellitus with unspecified complications: Secondary | ICD-10-CM

## 2012-06-15 DIAGNOSIS — E785 Hyperlipidemia, unspecified: Secondary | ICD-10-CM

## 2012-06-15 DIAGNOSIS — E669 Obesity, unspecified: Secondary | ICD-10-CM

## 2012-06-15 DIAGNOSIS — E119 Type 2 diabetes mellitus without complications: Secondary | ICD-10-CM

## 2012-06-15 LAB — COMPREHENSIVE METABOLIC PANEL
Albumin: 3.8 g/dL (ref 3.5–5.2)
BUN: 18 mg/dL (ref 6–23)
Calcium: 9.1 mg/dL (ref 8.4–10.5)
Chloride: 106 mEq/L (ref 96–112)
Glucose, Bld: 178 mg/dL — ABNORMAL HIGH (ref 70–99)
Potassium: 4.5 mEq/L (ref 3.5–5.1)

## 2012-06-15 LAB — LDL CHOLESTEROL, DIRECT: Direct LDL: 61.2 mg/dL

## 2012-06-15 LAB — LIPID PANEL
Cholesterol: 145 mg/dL (ref 0–200)
Triglycerides: 230 mg/dL — ABNORMAL HIGH (ref 0.0–149.0)

## 2012-06-15 LAB — MICROALBUMIN / CREATININE URINE RATIO: Microalb Creat Ratio: 0.6 mg/g (ref 0.0–30.0)

## 2012-06-15 NOTE — Progress Notes (Signed)
Patient ID: Todd Jenkins, male   DOB: 05-10-1961, 52 y.o.   MRN: [id number]  Patient Active Problem List  Diagnosis  . Coronary artery disease  . Diabetes mellitus type 2 with complications  . Hyperlipidemia LDL goal < 70  . Fatigue  . Special screening for malignant neoplasm of prostate  . Routine general medical examination at a health care facility  . Skin lesion of left arm  . Obesity (BMI 30-39.9)    Subjective:  CC:   Chief Complaint  Patient presents with  . Follow-up    HPI:   Todd Pinkham Sanbornis a 52 y.o. male who presents for diabetes follow up.  He is taking his medications as prescribed but not adhering to a carb restricted diet or exercise plan and has gained 6 lbs since November. Denies chest pian, dyspnea, joint pain, hypoglycemic events.    Past Medical History  Diagnosis Date  . Coronary artery disease   . H/O non-ST elevation myocardial infarction (NSTEMI) July 2012    s/p PTCA/ DESstent of 100% occluded LAD  . Diabetes mellitus     type II  . Hyperlipidemia   . Hypertension     Past Surgical History  Procedure Date  . Cardiac catheterization July 2012  . 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    Review of Systems:   12 Pt  review of systems was negative except those addressed in the HPI,     History   Social History  . Marital Status: Single    Spouse Name: N/A    Number of Children: N/A  . Years of Education: N/A   Occupational History  . Not on file.   Social History Main Topics  . Smoking status: Former Smoker -- 8 years    Types: Cigars    Quit date: 07/11/2010  . Smokeless tobacco: Never Used  . Alcohol Use: No  . Drug Use: No  . Sexually Active: Not on file   Other Topics Concern  . Not on file   Social History Narrative    . No narrative on file    Objective:  BP 108/82  Pulse 58  Temp 98.2 F (36.8 C) (Oral)  Resp 16  Wt 265 lb (120.203 kg)  SpO2 96%  General appearance: alert, cooperative and appears stated age Ears: normal TM's and external ear canals both ears Throat: lips, mucosa, and tongue normal; teeth and gums normal Neck: no adenopathy, no carotid bruit, supple, symmetrical, trachea midline and thyroid not enlarged, symmetric, no tenderness/mass/nodules Back: symmetric, no curvature. ROM normal. No CVA tenderness. Lungs: clear to auscultation bilaterally Heart: regular rate and rhythm, S1, S2 normal, no murmur, click, rub or gallop Abdomen: soft, non-tender; bowel sounds normal; no masses,  no organomegaly Pulses: 2+ and symmetric Skin: Skin color, texture, turgor normal. No rashes or lesions Lymph nodes: Cervical, supraclavicular, and axillary nodes normal.  Assessment and Plan:  Obesity (BMI 30-39.9) With a 14 lb wt gain since May. I have addressed  BMI and recommended a low glycemic index diet utilizing smaller more frequent meals to increase metabolism.  I have also recommended that patient start exercising with a goal of 30 minutes of aerobic exercise a minimum of 5 days per week. Screening for lipid disorders, thyroid and  diabetes to be done today.    Hyperlipidemia LDL goal < 70 LDL is at goal but trigs are elevated due to diet. Addressed with low GI diet  Diabetes mellitus type 2 with complications A1c has risen to 7.1.  No med changes today,  Diet addressed.    Updated Medication List Outpatient Encounter Prescriptions as of 06/15/2012  Medication Sig Dispense Refill  . aspirin 325 MG tablet Take 325 mg by mouth daily.      Marland Kitchen atorvastatin (LIPITOR) 40 MG tablet Take 1 tablet (40 mg total) by mouth daily.  30 tablet  6  . clopidogrel (PLAVIX) 75 MG tablet Take 1 tablet (75 mg total) by mouth daily.  30 tablet  6  . enalapril (VASOTEC) 2.5 MG tablet Take 1 tablet (2.5 mg  total) by mouth 2 (two) times daily.  60 tablet  6  . glipiZIDE (GLUCOTROL) 5 MG tablet Take 1 tablet (5 mg total) by mouth 2 (two) times daily.  60 tablet  6  . metFORMIN (GLUCOPHAGE) 500 MG tablet Take 1 tablet (500 mg total) by mouth 2 (two) times daily with a meal.  60 tablet  6  . metoprolol succinate (TOPROL-XL) 25 MG 24 hr tablet Take 0.5 tablets (12.5 mg total) by mouth daily.  30 tablet  6     Orders Placed This Encounter  Procedures  . Microalbumin / creatinine urine ratio  . Hemoglobin A1c  . Comprehensive metabolic panel  . Lipid panel  . LDL cholesterol, direct  . HM DIABETES EYE EXAM  . HM DIABETES FOOT EXAM    No Follow-up on file.

## 2012-06-15 NOTE — Patient Instructions (Addendum)
You need to lose 10%  Of your current body weight over the next 6 months   This is  my version of a  "Low GI"  Diet:  It is not ultra low carb, but will still lower your blood sugars and allow you to lose 5 to 10 lbs per month if you follow it carefully. All of the foods can be found at grocery stores and in bulk at [employer].  The Atkins protein bars and shakes are available in more varieties at [employer], [employer] and [employer].     7 AM Breakfast:  Low carbohydrate Protein  Shakes (I recommend the EAS AdvantEdge "Carb Control" shakes  Or the low carb shakes by Atkins.   Both are available everywhere:  In  cases at [employer]  Or in 4 packs at grocery stores and pharmacies  2.5 carbs  (Alternative is  a toasted Arnold's Sandwhich Thin w/ peanut butter, a "Bagel Thin" with cream cheese and salmon) or  a scrambled egg burrito made with a low carb tortilla .  Avoid cereal and bananas, oatmeal too unless you are cooking the old fashioned kind that takes 30-40 minutes to prepare.  the rest is overly processed, has minimal fiber, and is loaded with carbohydrates!   10 AM: Protein bar by Atkins (the snack size, under 200 cal).  There are many varieties , available widely again or in bulk in limited varieties at [employer]Js)  Other so called "protein bars" tend to be loaded with carbohydrates.  Remember, in food advertising, the word "energy" is synonymous for " carbohydrate."  Lunch: sandwich of turkey, (or any lunchmeat, grilled meat or canned tuna), fresh avocado, mayonnaise  and cheese on a lower carbohydrate pita bread, flatbread, or tortilla . Ok to use regular mayonnaise. The bread is the only source or carbohydrate that can be decreased (Joseph's makes a pita bread and a flat bread that are 50 cal and 4 net carbs ; Toufayan makes a low carb flatbread that's 100 cal and 9 net carbs  and  [employer] makes a low carb whole wheat tortilla  That is 210 cal and 6 net carbs)  3 PM:  Mid day :  Another protein bar,  Or a  cheese  stick (100 cal, 0 carbs),  Or 1 ounce of  almonds, walnuts, pistachios, pecans, peanuts,  Macadamia nuts. Or a Dannon light n Fit greek yogurt, 80 cal 8 net carbs . Avoid "granola"; the dried cranberries and raisins are loaded with carbohydrates. Mixed nuts ok if no raisins or cranberries or dried fruit.      6 PM  Dinner:  "mean and green:"  Meat/chicken/fish or a high protein legume; , with a green salad, and a low GI  Veggie (broccoli, cauliflower, green beans, spinach, brussel sprouts. Lima beans) : Avoid "Low fat dressings, as well as [place] and [place]! They are loaded with sugar! Instead use ranch, vinagrette,  Blue cheese, etc.  There is a low carb pasta by Dreamfield's available at [employer] that is acceptable and tastes great. Try Michel Angel's chicken piccata over low carb pasta. The chicken dish is 0 carbs, and can be found in frozen section at [employer] and [employer]. Also try Aaron Sanchez's "Carnitas" (pulled pork, no sauce,  0 carbs) and his pot roast.   both are in the refrigerated section at [employer]   Dreamfield's makes a low carb pasta only 5 g/serving.  Available at all grocery stores,  And tastes like normal   pasta  9 PM snack : Breyer's "low carb" fudgsicle or  ice cream bar (Carb Smart line), or  Weight Watcher's ice cream bar , or another "no sugar added" ice cream;a serving of fresh berries/cherries with whipped cream (Avoid bananas, pineapple, grapes  and watermelon on a regular basis because they are high in sugar)   Remember that snack Substitutions should be less than 10 carbs per serving and meals < 20 carbs. Remember to subtract fiber grams and sugar alcohols to get the "net carbs."  

## 2012-06-17 NOTE — Assessment & Plan Note (Signed)
A1c has risen to 7.1.  No med changes today,  Diet addressed.

## 2012-06-17 NOTE — Assessment & Plan Note (Signed)
With a 14 lb wt gain since May. I have addressed  BMI and recommended a low glycemic index diet utilizing smaller more frequent meals to increase metabolism.  I have also recommended that patient start exercising with a goal of 30 minutes of aerobic exercise a minimum of 5 days per week. Screening for lipid disorders, thyroid and diabetes to be done today.

## 2012-06-17 NOTE — Assessment & Plan Note (Signed)
LDL is at goal but trigs are elevated due to diet. Addressed with low GI diet

## 2012-09-18 ENCOUNTER — Ambulatory Visit: Payer: BC Managed Care – PPO | Admitting: Internal Medicine

## 2012-09-24 ENCOUNTER — Encounter: Payer: Self-pay | Admitting: Internal Medicine

## 2012-09-24 ENCOUNTER — Ambulatory Visit (INDEPENDENT_AMBULATORY_CARE_PROVIDER_SITE_OTHER): Payer: BC Managed Care – PPO | Admitting: Internal Medicine

## 2012-09-24 VITALS — BP 134/84 | HR 56 | Temp 98.7°F | Resp 16 | Wt 259.2 lb

## 2012-09-24 DIAGNOSIS — E669 Obesity, unspecified: Secondary | ICD-10-CM

## 2012-09-24 DIAGNOSIS — E785 Hyperlipidemia, unspecified: Secondary | ICD-10-CM

## 2012-09-24 DIAGNOSIS — E1059 Type 1 diabetes mellitus with other circulatory complications: Secondary | ICD-10-CM

## 2012-09-24 DIAGNOSIS — E118 Type 2 diabetes mellitus with unspecified complications: Secondary | ICD-10-CM

## 2012-09-24 LAB — COMPREHENSIVE METABOLIC PANEL
ALT: 57 U/L — ABNORMAL HIGH (ref 0–53)
BUN: 19 mg/dL (ref 6–23)
CO2: 27 mEq/L (ref 19–32)
Creatinine, Ser: 0.8 mg/dL (ref 0.4–1.5)
GFR: 101.73 mL/min (ref >60.00)
Total Bilirubin: 1.2 mg/dL (ref 0.3–1.2)

## 2012-09-24 LAB — LIPID PANEL
Cholesterol: 140 mg/dL (ref 0–200)
HDL: 50.7 mg/dL (ref >39.00)
LDL Cholesterol: 60 mg/dL (ref 0–99)
Total CHOL/HDL Ratio: 3
Triglycerides: 145 mg/dL (ref 0.0–149.0)

## 2012-09-24 NOTE — Patient Instructions (Addendum)
Continue the metoprolol at current dose   Increase the enalapril to 5 mg daily (once daily)   When you switch to smaller asa dose,  consider bedtine aleve for wrist and hip   Consider a wrist splint to wear at night to keep wrist in neutral position (I think you have early Carpal Tunnel )

## 2012-09-24 NOTE — Progress Notes (Signed)
Patient ID: Todd Jenkins, male   DOB: [birth year], 52 y.o.   MRN: [id number]       Patient Active Problem List   Diagnosis Date Noted  . Fatigue 12/08/2011  . Special screening for malignant neoplasm of prostate 12/08/2011  . Routine general medical examination at a health care facility 12/08/2011  . Skin lesion of left arm 12/08/2011  . Obesity (BMI 30-39.9) 12/08/2011  . Diabetes mellitus type 2 with complications 09/11/2011  . Hyperlipidemia LDL goal < 70 09/11/2011  . Coronary artery disease     Subjective:  CC:   Chief Complaint  Patient presents with  . Follow-up    3 month    HPI:   Todd Jenkins a 52 y.o. male who presents Follow up on diabetes mellitus, hyperlipidemia, hypertension and obesity.  He has lost 6 lbs thus far by making substitutions that he "can live with."  His fatigue has improved since reducing the metoprolol dose , but he has noticed elevation of blood pressure. He has had some pain in his left hip with external rotation. The pain is in the groin. It does not radiate.  Thee has been no trauma. There has been no prior worku   Past Medical History  Diagnosis Date  . Coronary artery disease   . H/O non-ST elevation myocardial infarction (NSTEMI) July 2012    s/p PTCA/ DESstent of 100% occluded LAD  . Diabetes mellitus     type II  . Hyperlipidemia   . Hypertension     Past Surgical History  Procedure Laterality Date  . Cardiac catheterization  July 2012  . 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    Review of Systems:  Patient denies headache, fevers, malaise, unintentional weight loss, skin rash, eye pain, sinus congestion and sinus pain, sore throat, dysphagia,  hemoptysis , cough, dyspnea, wheezing, chest pain, palpitations, orthopnea, edema, abdominal  pain, nausea, melena, diarrhea, constipation, flank pain, dysuria, hematuria, urinary  Frequency, nocturia, numbness, tingling, seizures,  Focal weakness, Loss of consciousness,  Tremor, insomnia, depression, anxiety, and suicidal ideation.     History   Social History  . Marital Status: Single    Spouse Name: N/A    Number of Children: N/A  . Years of Education: N/A   Occupational History  . Not on file.   Social History Main Topics  . Smoking status: Former Smoker -- 8 years    Types: Cigars    Quit date: 07/11/2010  . Smokeless tobacco: Never Used  . Alcohol Use: No  . Drug Use: No  . Sexually Active: Not on file   Other Topics Concern  . Not on file   Social History Narrative  . No narrative on file    Objective:  BP 134/84  Pulse 56  Temp(Src) 98.7 F (37.1 C) (Oral)  Resp 16  Wt 259 lb 4 oz (117.595 kg)  BMI 36.17 kg/m2  SpO2 99%  General appearance: alert, cooperative and appears stated age Ears: normal TM's and external ear canals both ears Throat: lips, mucosa, and tongue normal; teeth and gums normal Neck: no adenopathy, no carotid bruit, supple, symmetrical, trachea midline and thyroid not enlarged, symmetric, no tenderness/mass/nodules Back: symmetric, no curvature. ROM normal. No CVA tenderness. Lungs: clear to auscultation bilaterally Heart: regular rate and rhythm,  S1, S2 normal, no murmur, click, rub or gallop Abdomen: soft, non-tender; bowel sounds normal; no masses,  no organomegaly Pulses: 2+ and symmetric Skin: Skin color, texture, turgor normal. No rashes or lesions Lymph nodes: Cervical, supraclavicular, and axillary nodes normal. Foot exam:  Nails are well trimmed,  No callouses,  Sensation intact to microfilament  Assessment and Plan:  Obesity (BMI 30-39.9) Improving with low GI diet and exercise.   Diabetes mellitus type 2 with complications A1c is 7.0 on glipizide and metformin. Marland Kitchen He is up-to-date on eye exams and his foot exam is  normal as his his urine microalbumin to creatinine ratio at next visit. He is on the appropriate medications.  Hyperlipidemia LDL goal < 70 Well controlled, LDL excellent on current therapy.  Liver and kidney function are normal.  No changes today.  Repeat CMET and lipids in 6 months     Updated Medication List Outpatient Encounter Prescriptions as of 09/24/2012  Medication Sig Dispense Refill  . aspirin 325 MG tablet Take 325 mg by mouth daily.      Marland Kitchen atorvastatin (LIPITOR) 40 MG tablet Take 1 tablet (40 mg total) by mouth daily.  30 tablet  6  . clopidogrel (PLAVIX) 75 MG tablet Take 1 tablet (75 mg total) by mouth daily.  30 tablet  6  . glipiZIDE (GLUCOTROL) 5 MG tablet Take 1 tablet (5 mg total) by mouth 2 (two) times daily.  60 tablet  6  . metFORMIN (GLUCOPHAGE) 500 MG tablet Take 1 tablet (500 mg total) by mouth 2 (two) times daily with a meal.  60 tablet  6  . metoprolol succinate (TOPROL-XL) 25 MG 24 hr tablet Take 0.5 tablets (12.5 mg total) by mouth daily.  30 tablet  6  . [DISCONTINUED] enalapril (VASOTEC) 2.5 MG tablet Take 1 tablet (2.5 mg total) by mouth 2 (two) times daily.  60 tablet  6   No facility-administered encounter medications on file as of 09/24/2012.     Orders Placed This Encounter  Procedures  . Hemoglobin A1c  . Lipid panel  . Comprehensive metabolic panel    No Follow-up on file.

## 2012-09-24 NOTE — Assessment & Plan Note (Signed)
Improving with low GI diet and exercise.

## 2012-09-24 NOTE — Assessment & Plan Note (Signed)
Well controlled, LDL excellent on current therapy.  Liver and kidney function are normal.  No changes today.  Repeat CMET and lipids in 6 months   

## 2012-09-24 NOTE — Assessment & Plan Note (Addendum)
A1c is 7.0 on glipizide and metformin. Marland Kitchen He is up-to-date on eye exams and his foot exam is normal as his his urine microalbumin to creatinine ratio at next visit. He is on the appropriate medications.

## 2012-09-25 ENCOUNTER — Encounter: Payer: Self-pay | Admitting: Internal Medicine

## 2012-11-12 ENCOUNTER — Other Ambulatory Visit: Payer: Self-pay | Admitting: Internal Medicine

## 2012-12-07 ENCOUNTER — Other Ambulatory Visit: Payer: Self-pay | Admitting: Internal Medicine

## 2012-12-07 NOTE — Telephone Encounter (Signed)
Eprescribed.

## 2013-01-09 ENCOUNTER — Ambulatory Visit (INDEPENDENT_AMBULATORY_CARE_PROVIDER_SITE_OTHER): Payer: BC Managed Care – PPO | Admitting: Internal Medicine

## 2013-01-09 ENCOUNTER — Encounter: Payer: Self-pay | Admitting: Internal Medicine

## 2013-01-09 VITALS — BP 122/76 | HR 55 | Temp 98.3°F | Resp 14 | Ht 71.75 in | Wt 260.0 lb

## 2013-01-09 DIAGNOSIS — E669 Obesity, unspecified: Secondary | ICD-10-CM

## 2013-01-09 DIAGNOSIS — I2510 Atherosclerotic heart disease of native coronary artery without angina pectoris: Secondary | ICD-10-CM

## 2013-01-09 DIAGNOSIS — E119 Type 2 diabetes mellitus without complications: Secondary | ICD-10-CM

## 2013-01-09 DIAGNOSIS — E1169 Type 2 diabetes mellitus with other specified complication: Secondary | ICD-10-CM

## 2013-01-09 DIAGNOSIS — R7989 Other specified abnormal findings of blood chemistry: Secondary | ICD-10-CM

## 2013-01-09 LAB — LIPID PANEL: HDL: 46.4 mg/dL (ref >39.00)

## 2013-01-09 NOTE — Patient Instructions (Addendum)
I would like you to lose 26 lbs ober the next 6 months  This is  One version of a  "Low GI"  Diet:  It will still lower your blood sugars and allow you to lose 4 to 8  lbs  per month if you follow it carefully.  Your goal with exercise is a minimum of 30 minutes of aerobic exercise 5 days per week (Walking does not count once it becomes easy!)    All of the foods can be found at grocery stores and in bulk at [employer].  The Atkins protein bars and shakes are available in more varieties at Target, WalMart and [employer].     7 AM Breakfast:  Choose from the following:  Low carbohydrate Protein  Shakes (I recommend the [employer]vantEdge "Carb Control" shakes  Or the low carb shakes by Atkins.    2.5 carbs   Arnold's "Sandwhich Thin"toasted  w/ peanut butter (no jelly: about 20 net carbs  "Bagel Thin" with cream cheese and salmon: about 20 carbs   a scrambled egg/bacon/cheese burrito made with Mission's "carb balance" whole wheat tortilla  (about 10 net carbs )   Avoid cereal and bananas, oatmeal and cream of wheat and grits. They are loaded with carbohydrates!   10 AM: high protein snack  Protein bar by Atkins (the snack size, under 200 cal, usually < 6 net carbs).    A stick of cheese:  Around 1 carb,  100 cal     Dannon Light n Fit [place] Yogurt  (80 cal, 8 carbs)  Other so called "protein bars" and Greek yogurts tend to be loaded with carbohydrates.  Remember, in food advertising, the word "energy" is synonymous for " carbohydrate."  Lunch:   A Sandwich using the bread choices listed, Can use any  Eggs,  lunchmeat, grilled meat or canned tuna), avocado, regular mayo/mustard  and cheese.  A Salad using blue cheese, ranch,  Goddess or vinagrette,  No croutons or "confetti" and no "candied nuts" but regular nuts OK.   No pretzels or chips.  Pickles and miniature sweet peppers are a good low carb alternative that provide a "crunch"  The bread is the only source of carbohydrate in a sandwich and   can be decreased by trying some of these alternatives to traditional loaf bread  Joseph's makes a pita bread and a flat bread that are 50 cal and 4 net carbs available at BJs and WalMart.  This can be toasted to use with hummous as well  Toufayan makes a low carb flatbread that's 100 cal and 9 net carbs available at [employer] and [employer] makes 2 sizes of  Low carb whole wheat tortilla  (The large one is 210 cal and 6 net carbs) Avoid "Low fat dressings, as well as Reyne Dumas and [address] dressings They are loaded with sugar!   3 PM/ Mid day  Snack:  Consider  1 ounce of  almonds, walnuts, pistachios, pecans, peanuts,  Macadamia nuts or a nut medley.  Avoid "granola"; the dried cranberries and raisins are loaded with carbohydrates. Mixed nuts as long as there are no raisins,  cranberries or dried fruit.     6 PM  Dinner:     Meat/fowl/fish with a green salad, and either broccoli, cauliflower, green beans, spinach, brussel sprouts or  Lima beans. DO NOT BREAD THE PROTEIN!!      There is a low carb pasta by Dreamfield's that is acceptable  and tastes great: only 5 digestible carbs/serving.( All grocery stores but BJs carry it )  Try Kai Levins Angelo's chicken piccata or chicken or eggplant parm over low carb pasta.(Lowes and BJs)   Clifton Custard Sanchez's "Carnitas" (pulled pork, no sauce,  0 carbs) or his beef pot roast to make a dinner burrito (at BJ's)  Pesto over low carb pasta (bj's sells a good quality pesto in the center refrigerated section of the deli   Whole wheat pasta is still full of digestible carbs and  Not as low in glycemic index as Dreamfield's.   Brown rice is still rice,  So skip the rice and noodles if you eat [place] or [place] (or at least limit to 1/2 cup)  9 PM snack :   Breyer's "low carb" fudgsicle or  ice cream bar (Carb Smart line), or  Weight Watcher's ice cream bar , or another "no sugar added" ice cream;  a serving of fresh berries/cherries with whipped cream    Cheese or DANNON'S LlGHT [employer] FIT [place] YOGURT  Avoid bananas, pineapple, grapes  and watermelon on a regular basis because they are high in sugar.  THINK OF THEM AS DESSERT  Remember that snack Substitutions should be less than 10 NET carbs per serving and meals < 20 carbs. Remember to subtract fiber grams to get the "net carbs."

## 2013-01-10 ENCOUNTER — Ambulatory Visit: Payer: BC Managed Care – PPO

## 2013-01-10 ENCOUNTER — Telehealth: Payer: Self-pay | Admitting: Internal Medicine

## 2013-01-10 ENCOUNTER — Encounter: Payer: Self-pay | Admitting: Internal Medicine

## 2013-01-10 DIAGNOSIS — E1059 Type 1 diabetes mellitus with other circulatory complications: Secondary | ICD-10-CM

## 2013-01-10 DIAGNOSIS — R7989 Other specified abnormal findings of blood chemistry: Secondary | ICD-10-CM | POA: Insufficient documentation

## 2013-01-10 DIAGNOSIS — E119 Type 2 diabetes mellitus without complications: Secondary | ICD-10-CM

## 2013-01-10 LAB — COMPREHENSIVE METABOLIC PANEL
ALT: 35 U/L (ref 0–53)
Alkaline Phosphatase: 71 U/L (ref 39–117)
CO2: 20 mEq/L (ref 19–32)
GFR: 74.44 mL/min (ref >60.00)
Sodium: 140 mEq/L (ref 135–145)
Total Bilirubin: 1.1 mg/dL (ref 0.3–1.2)
Total Protein: 7.6 g/dL (ref 6.0–8.3)

## 2013-01-10 LAB — LDL CHOLESTEROL, DIRECT: Direct LDL: 66.4 mg/dL

## 2013-01-10 NOTE — Telephone Encounter (Signed)
ok 

## 2013-01-10 NOTE — Telephone Encounter (Signed)
Todd Jenkins,  Can you release the CMET?  That was a mistake!  thanks

## 2013-01-10 NOTE — Assessment & Plan Note (Addendum)
I have addressed  BMI and recommended a wt loss of 26 lbs over the night 6 months using a  low glycemic index diet  utilizing smaller more frequent meals to increase metabolism.  I have also recommended that patient start exercising with a goal of 30 minutes of aerobic exercise a minimum of 5 days per week.  Foot exam was normal today,  He is on the appropriate medications and has had his annual eye exam with Dr Alexia Freestone .labs are pending

## 2013-01-10 NOTE — Assessment & Plan Note (Signed)
He has been asymptomatic, is taking his medications including daily aspirin, ACE inhibitor, beta blocker and statin.  Continue semi annual follow up with cardiology.    

## 2013-01-10 NOTE — Assessment & Plan Note (Signed)
liver enzyme was  elevated at last visit for unclear reasons.a dditional blood tests to rule out various infections and autoimmune disorders that can elevate liver enzymes (hepatitis, hemochromatosis, etc) and will order an an ultrasound of the liver

## 2013-01-10 NOTE — Telephone Encounter (Signed)
Do you need the other orders? I still have the tubes

## 2013-01-10 NOTE — Telephone Encounter (Signed)
No,  i don't  Thank you!!!

## 2013-01-10 NOTE — Assessment & Plan Note (Signed)
I have addressed  BMI and recommended wt loss of 10% of body weigh over the next 6 months using a low glycemic index diet and regular exercise a minimum of 5 days per week.   

## 2013-01-10 NOTE — Progress Notes (Signed)
Patient ID: Todd Jenkins, male   DOB: 17-Apr-1961, 52 y.o.   MRN: [id number]   Patient Active Problem List   Diagnosis Date Noted  . Other abnormal blood chemistry 01/10/2013  . Fatigue 12/08/2011  . Special screening for malignant neoplasm of prostate 12/08/2011  . Routine general medical examination at a health care facility 12/08/2011  . Skin lesion of left arm 12/08/2011  . Obesity (BMI 30-39.9) 12/08/2011  . Diabetes mellitus type 2 in obese 09/11/2011  . Hyperlipidemia LDL goal < 70 09/11/2011  . Coronary artery disease     Subjective:  CC:   Chief Complaint  Patient presents with  . Follow-up  . Diabetes    HPI:   Todd Jenkins a 52 y.o. male who presents for follow up on diabetes mellitus, hyperlipidemia, hypertension and obesity.  His weight loss has plateaued and he has gained a lb since last bisit. eHis fatigue has improved since reducing the metoprolol dose , He has no joint pain currently.  Taking his medications without problems.,  Does not check blood sugars.,  Not exercising regularly.    Past Medical History  Diagnosis Date  . Coronary artery disease   . H/O non-ST elevation myocardial infarction (NSTEMI) July 2012    s/p PTCA/ DESstent of 100% occluded LAD  . Diabetes mellitus     type II  . Hyperlipidemia   . Hypertension     Past Surgical History  Procedure Laterality Date  . Cardiac catheterization  July 2012  . 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    Review of Systems:   12 Pt  review of systems was negative except those addressed in the HPI,     History   Social History  . Marital Status: Single    Spouse Name: N/A    Number of Children: N/A  . Years of Education: N/A   Occupational History  . Not on file.   Social History Main  Topics  . Smoking status: Former Smoker -- 8 years    Types: Cigars    Quit date: 07/11/2010  . Smokeless tobacco: Never Used  . Alcohol Use: No  . Drug Use: No  . Sexual Activity: Not on file   Other Topics Concern  . Not on file   Social History Narrative  . No narrative on file    Objective:  Filed Vitals:   01/09/13 0805  BP: 122/76  Pulse: 55  Temp: 98.3 F (36.8 C)  Resp: 14     General appearance: alert, cooperative and appears stated age Ears: normal TM's and external ear canals both ears Throat: lips, mucosa, and tongue normal; teeth and gums normal Neck: no adenopathy, no carotid bruit, supple, symmetrical, trachea midline and thyroid not enlarged, symmetric, no tenderness/mass/nodules Back: symmetric, no curvature. ROM normal. No CVA tenderness. Lungs: clear to auscultation bilaterally Heart: regular rate and rhythm, S1, S2 normal, no murmur, click, rub or gallop Abdomen: soft, non-tender; bowel sounds normal; no masses,  no organomegaly Pulses: 2+ and symmetric Skin: Skin color, texture, turgor normal. No rashes or lesions Lymph nodes: Cervical, supraclavicular, and axillary nodes normal. Foot exam:  Nails are well trimmed,  No callouses,  Sensation intact to microfilament   Assessment and Plan:  Diabetes mellitus type 2 in  obese I have addressed  BMI and recommended a wt loss of 26 lbs over the night 6 months using a  low glycemic index diet  utilizing smaller more frequent meals to increase metabolism.  I have also recommended that patient start exercising with a goal of 30 minutes of aerobic exercise a minimum of 5 days per week.  Foot exam was normal today,  He is on the appropriate medications and has had his annual eye exam with Dr Alexia Freestone .labs are pending    Coronary artery disease He has been asymptomatic, is taking his medications including daily aspirin, ACE inhibitor, beta blocker and statin.  Continue semi annual follow up with cardiology.       Obesity (BMI 30-39.9) I have addressed  BMI and recommended wt loss of 10% of body weigh over the next 6 months using a low glycemic index diet and regular exercise a minimum of 5 days per week.     Other abnormal blood chemistry  liver enzyme was  elevated at last visit for unclear reasons.a dditional blood tests to rule out various infections and autoimmune disorders that can elevate liver enzymes (hepatitis, hemochromatosis, etc) and will order an an ultrasound of the liver    Updated Medication List Outpatient Encounter Prescriptions as of 01/09/2013  Medication Sig Dispense Refill  . aspirin 325 MG tablet Take 325 mg by mouth daily.      Marland Kitchen atorvastatin (LIPITOR) 40 MG tablet TAKE 1 TABLET BY MOUTH DAILY.  30 tablet  5  . clopidogrel (PLAVIX) 75 MG tablet TAKE 1 TABLET BY MOUTH EVERY DAY  30 tablet  5  . enalapril (VASOTEC) 2.5 MG tablet TAKE 1 TABLET (2.5 MG TOTAL) BY MOUTH 2 (TWO) TIMES DAILY.  60 tablet  6  . glipiZIDE (GLUCOTROL) 5 MG tablet TAKE 1 TABLET BY MOUTH TWICE A DAY  60 tablet  3  . metFORMIN (GLUCOPHAGE) 500 MG tablet TAKE 1 TABLET (500 MG TOTAL) BY MOUTH 2 (TWO) TIMES DAILY WITH A MEAL.  60 tablet  3  . metoprolol succinate (TOPROL-XL) 25 MG 24 hr tablet Take 0.5 tablets (12.5 mg total) by mouth daily.  30 tablet  6   No facility-administered encounter medications on file as of 01/09/2013.     Orders Placed This Encounter  Procedures  . Lipid panel  . Hemoglobin A1c  . Comprehensive metabolic panel  . Iron and TIBC  . Hepatitis B surface antigen  . Hepatitis B core antibody, total  . Ferritin  . Hepatitis B surface antibody  . Hepatitis C antibody  . ANA  . Anti-Smith antibody  . HM DIABETES EYE EXAM  . HM DIABETES FOOT EXAM    No Follow-up on file.

## 2013-03-09 ENCOUNTER — Other Ambulatory Visit: Payer: Self-pay | Admitting: Internal Medicine

## 2013-04-04 ENCOUNTER — Other Ambulatory Visit: Payer: Self-pay

## 2013-04-08 ENCOUNTER — Ambulatory Visit: Payer: BC Managed Care – PPO | Admitting: Cardiovascular Disease

## 2013-04-11 ENCOUNTER — Encounter: Payer: Self-pay | Admitting: Internal Medicine

## 2013-04-11 ENCOUNTER — Ambulatory Visit (INDEPENDENT_AMBULATORY_CARE_PROVIDER_SITE_OTHER): Payer: BC Managed Care – PPO | Admitting: Internal Medicine

## 2013-04-11 VITALS — BP 132/86 | HR 57 | Temp 97.9°F | Resp 12 | Ht 72.0 in | Wt 261.2 lb

## 2013-04-11 DIAGNOSIS — R5381 Other malaise: Secondary | ICD-10-CM

## 2013-04-11 DIAGNOSIS — E669 Obesity, unspecified: Secondary | ICD-10-CM

## 2013-04-11 DIAGNOSIS — Z125 Encounter for screening for malignant neoplasm of prostate: Secondary | ICD-10-CM

## 2013-04-11 DIAGNOSIS — Z0000 Encounter for general adult medical examination without abnormal findings: Secondary | ICD-10-CM

## 2013-04-11 DIAGNOSIS — E119 Type 2 diabetes mellitus without complications: Secondary | ICD-10-CM

## 2013-04-11 DIAGNOSIS — E785 Hyperlipidemia, unspecified: Secondary | ICD-10-CM

## 2013-04-11 DIAGNOSIS — E1169 Type 2 diabetes mellitus with other specified complication: Secondary | ICD-10-CM

## 2013-04-11 DIAGNOSIS — R7989 Other specified abnormal findings of blood chemistry: Secondary | ICD-10-CM

## 2013-04-11 DIAGNOSIS — Z23 Encounter for immunization: Secondary | ICD-10-CM

## 2013-04-11 LAB — COMPREHENSIVE METABOLIC PANEL
Alkaline Phosphatase: 69 U/L (ref 39–117)
BUN: 15 mg/dL (ref 6–23)
Creatinine, Ser: 0.9 mg/dL (ref 0.4–1.5)
Glucose, Bld: 157 mg/dL — ABNORMAL HIGH (ref 70–99)
Total Bilirubin: 1.3 mg/dL — ABNORMAL HIGH (ref 0.3–1.2)

## 2013-04-11 LAB — CBC WITH DIFFERENTIAL/PLATELET
Basophils Relative: 0.4 % (ref 0.0–3.0)
Eosinophils Absolute: 0.2 10*3/uL (ref 0.0–0.7)
Eosinophils Relative: 2.9 % (ref 0.0–5.0)
HCT: 42.5 % (ref 39.0–52.0)
Hemoglobin: 14.6 g/dL (ref 13.0–17.0)
Lymphs Abs: 1.7 10*3/uL (ref 0.7–4.0)
Monocytes Relative: 8.6 % (ref 3.0–12.0)
Neutro Abs: 4 10*3/uL (ref 1.4–7.7)
RBC: 5 Mil/uL (ref 4.22–5.81)
WBC: 6.4 10*3/uL (ref 4.5–10.5)

## 2013-04-11 LAB — PSA: PSA: 0.48 ng/mL (ref 0.10–4.00)

## 2013-04-11 LAB — MICROALBUMIN / CREATININE URINE RATIO
Creatinine,U: 159.5 mg/dL
Microalb Creat Ratio: 2.1 mg/g (ref 0.0–30.0)

## 2013-04-11 LAB — LIPID PANEL
Cholesterol: 147 mg/dL (ref 0–200)
LDL Cholesterol: 71 mg/dL (ref 0–99)
VLDL: 27.4 mg/dL (ref 0.0–40.0)

## 2013-04-11 LAB — HEMOGLOBIN A1C: Hgb A1c MFr Bld: 7.2 % — ABNORMAL HIGH (ref 4.6–6.5)

## 2013-04-11 MED ORDER — METFORMIN HCL 500 MG PO TABS: ORAL_TABLET | ORAL | Status: DC

## 2013-04-11 MED ORDER — CLOPIDOGREL BISULFATE 75 MG PO TABS: ORAL_TABLET | ORAL | Status: DC

## 2013-04-11 MED ORDER — GLIPIZIDE 5 MG PO TABS: ORAL_TABLET | ORAL | Status: DC

## 2013-04-11 MED ORDER — ATORVASTATIN CALCIUM 40 MG PO TABS: ORAL_TABLET | ORAL | Status: DC

## 2013-04-11 MED ORDER — METOPROLOL SUCCINATE ER 25 MG PO TB24: 12.5000 mg | ORAL_TABLET | Freq: Every day | ORAL | Status: DC

## 2013-04-11 MED ORDER — ENALAPRIL MALEATE 2.5 MG PO TABS: ORAL_TABLET | ORAL | Status: DC

## 2013-04-11 NOTE — Patient Instructions (Addendum)
.  You had your annual wellness exam today.  Your blood pressure is well controlled. We have made no changes to your medications today.  Your diet is fine,  It's the exercise you need to add  Please check your blood sugar the next time you feel "bad" to make sure it is not a hypoglycemic event.    We will schedule your mammogram soon.  You received the influenza and the pneumonia vaccine today.  We will contact you with the bloodwork results

## 2013-04-11 NOTE — Progress Notes (Signed)
Patient ID: Todd Jenkins, male   DOB: [birth year], 52 y.o.   MRN: [id number]  The patient is here for his annual male physical examination and management of diabetes  and other chronic and acute problems.   The risk factors are reflected in the social history.  The roster of all physicians providing medical care to patient - is listed in the Snapshot section of the chart.  Activities of daily living:  The patient is 100% independent in all ADLs: dressing, toileting, feeding as well as independent mobility  Home safety : The patient has smoke detectors in the home. He wears seatbelts.  There are no firearms at home. There is no violence in the home.   There is no risks for hepatitis, STDs or HIV. There is no   history of blood transfusion. There is no travel history to infectious disease endemic areas of the world.  The patient has seen their dentist in the last six month and  their eye doctor in the last year.  They do not  have excessive sun exposure. They have seen a dermatoloigist in the last year. Discussed the need for sun protection: hats, long sleeves and use of sunscreen if there is significant sun exposure.   Diet: the importance of a healthy diet is discussed. They do have a healthy diet.  The benefits of regular aerobic exercise were discussed. He exercises a minimum of 30 minutes  5 days per week. Depression screen: there are no signs or vegative symptoms of depression- irritability, change in appetite, anhedonia, sadness/tearfullness.  The following portions of the patient's history were reviewed and updated as appropriate: allergies, current medications, past family history, past medical history,  past surgical history, past social history  and problem list.  Visual acuity was not assessed per patient preference since he has regular follow up with his ophthalmologist. Hearing and body mass index were assessed and reviewed.   During the course of the visit the patient was  educated and counseled about appropriate screening and preventive services including :  nutrition counseling, colorectal cancer screening, and recommended immunizations.    Objective:   BP 132/86  Pulse 57  Temp(Src) 97.9 F (36.6 C) (Oral)  Resp 12  Ht 6' (1.829 m)  Wt 261 lb 4 oz (118.502 kg)  BMI 35.42 kg/m2  SpO2 99%  General Appearance:    Alert, cooperative, no distress, appears stated age  Head:    Normocephalic, without obvious abnormality, atraumatic  Eyes:    PERRL, conjunctiva/corneas clear, EOM's intact, fundi    benign, both eyes       Ears:    Normal TM's and external ear canals, both ears  Nose:   Nares normal, septum midline, mucosa normal, no drainage   or sinus tenderness  Throat:   Lips, mucosa, and tongue normal; teeth and gums normal  Neck:   Supple, symmetrical, trachea midline, no adenopathy;       thyroid:  No enlargement/tenderness/nodules; no carotid   bruit or JVD  Back:     Symmetric, no curvature, ROM normal, no CVA tenderness  Lungs:     Clear to auscultation bilaterally, respirations unlabored  Chest wall:    No tenderness or deformity  Heart:    Regular rate and rhythm, S1 and S2 normal, no murmur, rub   or gallop  Abdomen:     Soft, non-tender, bowel sounds active all four quadrants,    no masses, no organomegaly  Genitalia:    Normal male  without lesion, discharge or tenderness  Rectal:    Normal tone, normal prostate, no masses or tenderness;   guaiac negative stool  Extremities:   Extremities normal, atraumatic, no cyanosis or edema  Pulses:   2+ and symmetric all extremities  Skin:   Skin color, texture, turgor normal, no rashes or lesions  Lymph nodes:   Cervical, supraclavicular, and axillary nodes normal  Neurologic:   CNII-XII intact. Normal strength, sensation and reflexes      throughout   Assessment and Plan:   Diabetes mellitus type 2 in obese hemoglobin A1c is elevated at 7.2  He has lost some control due to his travel  schedule resulting in eating out a lot and not exercising at all.  He is -date on eye exams and his foot exam is normal.  he has no proteinuria by today's urine test.  Diet and need for regular exercise  reviewed . No additional medications today.  Obesity (BMI 30-39.9) Body mass index is 35.42 kg/(m^2).  I have addressed  BMI and recommended wt loss of 10% of body weight over the next 6 months using a low glycemic index diet and regular exercise a minimum of 5 days per week.    Routine general medical examination at a health care facility Annual male exam was done including testicular and prostate exam. PSA is normal.  Colon ca screening was reviewed and updated. Lab Results  Component Value Date   PSA 0.48 04/11/2013   PSA 0.49 12/08/2011   PSA 0.53 09/09/2011    Hyperlipidemia LDL goal < 70 Well controlled on current statin therapy.   Liver enzymes are stable , no changes today.  Lab Results  Component Value Date   CHOL 147 04/11/2013   HDL 48.70 04/11/2013   LDLCALC 71 04/11/2013   LDLDIRECT 66.4 01/09/2013   TRIG 137.0 04/11/2013   CHOLHDL 3 04/11/2013   Lab Results  Component Value Date   ALT 55* 04/11/2013   AST 27 04/11/2013   ALKPHOS 69 04/11/2013   BILITOT 1.3* 04/11/2013     Other abnormal blood chemistry ALT is elevated again, mildly.   Serologies for viral hepatitis , iron overload and autoimmune hepatitis will be ordered with next labdraw.    Updated Medication List Outpatient Encounter Prescriptions as of 04/11/2013  Medication Sig  . aspirin 81 MG tablet Take 81 mg by mouth daily.  Marland Kitchen atorvastatin (LIPITOR) 40 MG tablet TAKE 1 TABLET BY MOUTH DAILY.  Marland Kitchen clopidogrel (PLAVIX) 75 MG tablet TAKE 1 TABLET BY MOUTH EVERY DAY  . enalapril (VASOTEC) 2.5 MG tablet TAKE 1 TABLET (2.5 MG TOTAL) BY MOUTH 2 (TWO) TIMES DAILY.  Marland Kitchen glipiZIDE (GLUCOTROL) 5 MG tablet TAKE 1 TABLET BY MOUTH TWICE A DAY  . metFORMIN (GLUCOPHAGE) 500 MG tablet TAKE 1 TABLET (500 MG TOTAL) BY  MOUTH 2 (TWO) TIMES DAILY WITH A MEAL.  . metoprolol succinate (TOPROL-XL) 25 MG 24 hr tablet Take 0.5 tablets (12.5 mg total) by mouth daily.  . [DISCONTINUED] atorvastatin (LIPITOR) 40 MG tablet TAKE 1 TABLET BY MOUTH DAILY.  . [DISCONTINUED] clopidogrel (PLAVIX) 75 MG tablet TAKE 1 TABLET BY MOUTH EVERY DAY  . [DISCONTINUED] enalapril (VASOTEC) 2.5 MG tablet TAKE 1 TABLET (2.5 MG TOTAL) BY MOUTH 2 (TWO) TIMES DAILY.  . [DISCONTINUED] glipiZIDE (GLUCOTROL) 5 MG tablet TAKE 1 TABLET BY MOUTH TWICE A DAY  . [DISCONTINUED] metFORMIN (GLUCOPHAGE) 500 MG tablet TAKE 1 TABLET (500 MG TOTAL) BY MOUTH 2 (TWO) TIMES DAILY WITH A MEAL.  . [  DISCONTINUED] metoprolol succinate (TOPROL-XL) 25 MG 24 hr tablet Take 0.5 tablets (12.5 mg total) by mouth daily.  . [DISCONTINUED] aspirin 325 MG tablet Take 325 mg by mouth daily.

## 2013-04-13 ENCOUNTER — Encounter: Payer: Self-pay | Admitting: Internal Medicine

## 2013-04-13 NOTE — Assessment & Plan Note (Signed)
ALT is elevated again, mildly.   Serologies for viral hepatitis , iron overload and autoimmune hepatitis will be ordered with next labdraw.

## 2013-04-13 NOTE — Assessment & Plan Note (Signed)
Well controlled on current statin therapy.   Liver enzymes are stable , no changes today.  Lab Results  Component Value Date   CHOL 147 04/11/2013   HDL 48.70 04/11/2013   LDLCALC 71 04/11/2013   LDLDIRECT 66.4 01/09/2013   TRIG 137.0 04/11/2013   CHOLHDL 3 04/11/2013   Lab Results  Component Value Date   ALT 55* 04/11/2013   AST 27 04/11/2013   ALKPHOS 69 04/11/2013   BILITOT 1.3* 04/11/2013

## 2013-04-13 NOTE — Addendum Note (Signed)
Addended by: Sherlene Shams on: 04/13/2013 05:44 PM   Modules accepted: Orders, Level of Service

## 2013-04-13 NOTE — Assessment & Plan Note (Signed)
Annual male exam was done including testicular and prostate exam. PSA is normal.  Colon ca screening was reviewed and updated. Lab Results  Component Value Date   PSA 0.48 04/11/2013   PSA 0.49 12/08/2011   PSA 0.53 09/09/2011

## 2013-04-13 NOTE — Assessment & Plan Note (Addendum)
Body mass index is 35.42 kg/(m^2).  I have addressed  BMI and recommended wt loss of 10% of body weight over the next 6 months using a low glycemic index diet and regular exercise a minimum of 5 days per week.

## 2013-04-13 NOTE — Assessment & Plan Note (Addendum)
hemoglobin A1c is elevated at 7.2  He has lost some control due to his travel schedule resulting in eating out a lot and not exercising at all.  He is -date on eye exams and his foot exam is normal.  he has no proteinuria by today's urine test.  Diet and need for regular exercise  reviewed . No additional medications today.

## 2013-04-15 NOTE — Telephone Encounter (Signed)
Mailed unread message to pt  

## 2013-04-19 ENCOUNTER — Ambulatory Visit: Payer: BC Managed Care – PPO | Admitting: Cardiovascular Disease

## 2013-05-06 ENCOUNTER — Other Ambulatory Visit: Payer: Self-pay | Admitting: Internal Medicine

## 2013-07-16 ENCOUNTER — Encounter: Payer: Self-pay | Admitting: Internal Medicine

## 2013-07-17 ENCOUNTER — Ambulatory Visit: Payer: BC Managed Care – PPO | Admitting: Internal Medicine

## 2013-07-23 ENCOUNTER — Encounter: Payer: Self-pay | Admitting: Internal Medicine

## 2013-07-26 ENCOUNTER — Ambulatory Visit: Payer: BC Managed Care – PPO | Admitting: Internal Medicine

## 2013-08-29 ENCOUNTER — Ambulatory Visit (INDEPENDENT_AMBULATORY_CARE_PROVIDER_SITE_OTHER): Payer: BC Managed Care – PPO | Admitting: Internal Medicine

## 2013-08-29 ENCOUNTER — Encounter: Payer: Self-pay | Admitting: Internal Medicine

## 2013-08-29 VITALS — BP 132/80 | HR 61 | Temp 98.6°F | Resp 16 | Wt 260.0 lb

## 2013-08-29 DIAGNOSIS — R7989 Other specified abnormal findings of blood chemistry: Secondary | ICD-10-CM

## 2013-08-29 DIAGNOSIS — E119 Type 2 diabetes mellitus without complications: Secondary | ICD-10-CM

## 2013-08-29 DIAGNOSIS — E785 Hyperlipidemia, unspecified: Secondary | ICD-10-CM

## 2013-08-29 DIAGNOSIS — E1169 Type 2 diabetes mellitus with other specified complication: Secondary | ICD-10-CM

## 2013-08-29 DIAGNOSIS — E1059 Type 1 diabetes mellitus with other circulatory complications: Secondary | ICD-10-CM

## 2013-08-29 DIAGNOSIS — E669 Obesity, unspecified: Secondary | ICD-10-CM

## 2013-08-29 LAB — HEMOGLOBIN A1C
Hgb A1c MFr Bld: 6.9 % — ABNORMAL HIGH (ref <5.7)
MEAN PLASMA GLUCOSE: 151 mg/dL — AB (ref <117)

## 2013-08-29 NOTE — Progress Notes (Signed)
Pre-visit discussion using our clinic review tool. No additional management support is needed unless otherwise documented below in the visit note.  

## 2013-08-30 ENCOUNTER — Encounter: Payer: Self-pay | Admitting: Internal Medicine

## 2013-08-30 LAB — COMPREHENSIVE METABOLIC PANEL
ALBUMIN: 4.2 g/dL (ref 3.5–5.2)
ALT: 41 U/L (ref 0–53)
AST: 22 U/L (ref 0–37)
Alkaline Phosphatase: 71 U/L (ref 39–117)
BUN: 17 mg/dL (ref 6–23)
CALCIUM: 9.4 mg/dL (ref 8.4–10.5)
CHLORIDE: 103 meq/L (ref 96–112)
CO2: 26 meq/L (ref 19–32)
Creat: 0.84 mg/dL (ref 0.50–1.35)
Glucose, Bld: 223 mg/dL — ABNORMAL HIGH (ref 70–99)
Potassium: 3.9 mEq/L (ref 3.5–5.3)
Sodium: 138 mEq/L (ref 135–145)
Total Bilirubin: 0.9 mg/dL (ref 0.2–1.2)
Total Protein: 6.5 g/dL (ref 6.0–8.3)

## 2013-08-30 LAB — HEPATIC FUNCTION PANEL
ALBUMIN: 4.2 g/dL (ref 3.5–5.2)
ALT: 41 U/L (ref 0–53)
AST: 22 U/L (ref 0–37)
Alkaline Phosphatase: 71 U/L (ref 39–117)
BILIRUBIN DIRECT: 0.2 mg/dL (ref 0.0–0.3)
BILIRUBIN TOTAL: 0.9 mg/dL (ref 0.2–1.2)
Indirect Bilirubin: 0.7 mg/dL (ref 0.2–1.2)
TOTAL PROTEIN: 6.5 g/dL (ref 6.0–8.3)

## 2013-08-30 LAB — IRON AND TIBC
%SAT: 21 % (ref 20–55)
Iron: 62 ug/dL (ref 42–165)
TIBC: 296 ug/dL (ref 215–435)
UIBC: 234 ug/dL (ref 125–400)

## 2013-08-30 LAB — ANTI-SMITH ANTIBODY: ENA SM Ab Ser-aCnc: <1

## 2013-08-30 LAB — HEPATITIS C ANTIBODY: HCV AB: NEGATIVE

## 2013-08-30 LAB — HEPATITIS B CORE ANTIBODY, TOTAL: HEP B C TOTAL AB: NONREACTIVE

## 2013-08-30 LAB — FERRITIN: Ferritin: 126 ng/mL (ref 22–322)

## 2013-08-30 LAB — HEPATITIS B SURFACE ANTIGEN: HEP B S AG: NEGATIVE

## 2013-08-30 LAB — ANA: ANA: NEGATIVE

## 2013-08-30 LAB — HEPATITIS B SURFACE ANTIBODY,QUALITATIVE: Hep B S Ab: NEGATIVE

## 2013-08-31 NOTE — Assessment & Plan Note (Addendum)
Historically well-controlled on current medications.  hemoglobin A1c has been consistently at or  less than 7.0 . Patient is up-to-date on eye exams and foot exam is normal today. Patient  Has microalbuminuria. . Patient is tolerating statin therapy for CAD risk reduction and on ACE/ARB for reduction in proteinuria.

## 2013-08-31 NOTE — Assessment & Plan Note (Signed)
Well controlled on current statin therapy.   Liver enzymes are stable , no changes today.  Lab Results  Component Value Date   CHOL 147 04/11/2013   HDL 48.70 04/11/2013   LDLCALC 71 04/11/2013   LDLDIRECT 66.4 01/09/2013   TRIG 137.0 04/11/2013   CHOLHDL 3 04/11/2013   Lab Results  Component Value Date   ALT 41 08/29/2013   ALT 41 08/29/2013   AST 22 08/29/2013   AST 22 08/29/2013   ALKPHOS 71 08/29/2013   ALKPHOS 71 08/29/2013   BILITOT 0.9 08/29/2013   BILITOT 0.9 08/29/2013        

## 2013-08-31 NOTE — Assessment & Plan Note (Signed)
Serologies for viral hepatitis , iron overload and autoimmune hepatitis were checked due to recurrent ALT elevations and were normal.

## 2013-08-31 NOTE — Assessment & Plan Note (Signed)
I have addressed  BMI and recommended a low glycemic index diet utilizing smaller more frequent meals to increase metabolism.  I have also recommended that patient start exercising with a goal of 30 minutes of aerobic exercise a minimum of 5 days per week.  

## 2013-08-31 NOTE — Progress Notes (Signed)
Patient ID: Todd Jenkins, male   DOB: November 29, 1960, 53 y.o.   MRN: [id number]  Patient Active Problem List   Diagnosis Date Noted  . Other abnormal blood chemistry 01/10/2013  . Special screening for malignant neoplasm of prostate 12/08/2011  . Routine general medical examination at a health care facility 12/08/2011  . Skin lesion of left arm 12/08/2011  . Obesity (BMI 30-39.9) 12/08/2011  . Diabetes mellitus type 2 in obese 09/11/2011  . Hyperlipidemia LDL goal < 70 09/11/2011  . Coronary artery disease     Subjective:  CC:   Chief Complaint  Patient presents with  . Follow-up  . Diabetes    HPI:   Todd Jenkins is a 53 y.o. male who presents for  3 month follow up on DM, hyperlipidemia , and obesity.  At last visit he has mildly elevated liver enzymes.  He has not lost any weight since last visit.  He has been noncompliant with diet  For several weeks  Over the holidays.  He is taking his medications as prescribed.    Past Medical History  Diagnosis Date  . Coronary artery disease   . H/O non-ST elevation myocardial infarction (NSTEMI) July 2012    s/p PTCA/ DESstent of 100% occluded LAD  . Diabetes mellitus     type II  . Hyperlipidemia   . Hypertension     Past Surgical History  Procedure Laterality Date  . Cardiac catheterization  July 2012  . 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    Review of Systems:   Patient denies headache, fevers, malaise, unintentional weight loss, skin rash, eye pain, sinus congestion and sinus pain, sore throat, dysphagia,  hemoptysis , cough, dyspnea, wheezing, chest pain, palpitations, orthopnea, edema, abdominal pain, nausea, melena, diarrhea, constipation, flank pain, dysuria, hematuria, urinary  Frequency, nocturia, numbness, tingling,  seizures,  Focal weakness, Loss of consciousness,  Tremor, insomnia, depression, anxiety, and suicidal ideation.     History   Social History  . Marital Status: Single    Spouse Name: N/A    Number of Children: N/A  . Years of Education: N/A   Occupational History  . Not on file.   Social History Main Topics  . Smoking status: Former Smoker -- 8 years    Types: Cigars    Quit date: 07/11/2010  . Smokeless tobacco: Never Used  . Alcohol Use: No  . Drug Use: No  . Sexual Activity: Not on file   Other Topics Concern  . Not on file   Social History Narrative  . No narrative on file    Objective:  Filed Vitals:   08/29/13 1434  BP: 132/80  Pulse: 61  Temp: 98.6 F (37 C)  Resp: 16     General appearance: alert, cooperative and appears stated age Ears: normal TM's and external ear canals both ears Throat: lips, mucosa, and tongue normal; teeth and gums normal Neck: no adenopathy, no carotid bruit, supple, symmetrical, trachea midline and thyroid not enlarged, symmetric, no tenderness/mass/nodules Back: symmetric, no curvature. ROM normal. No CVA tenderness. Lungs: clear to auscultation bilaterally Heart: regular rate and rhythm, S1, S2 normal, no murmur, click, rub or gallop Abdomen: soft, non-tender; bowel sounds normal; no masses,  no organomegaly Pulses: 2+ and symmetric Skin: Skin  color, texture, turgor normal. No rashes or lesions Lymph nodes: Cervical, supraclavicular, and axillary nodes normal.  Assessment and Plan:  Diabetes mellitus type 2 in obese Historically well-controlled on current medications.  hemoglobin A1c has been consistently at or  less than 7.0 . Patient is up-to-date on eye exams and foot exam is normal today. Patient  Has microalbuminuria. . Patient is tolerating statin therapy for CAD risk reduction and on ACE/ARB for reduction in proteinuria.    Hyperlipidemia LDL goal < 70 Well controlled on current statin therapy.   Liver enzymes are  stable , no changes today.  Lab Results  Component Value Date   CHOL 147 04/11/2013   HDL 48.70 04/11/2013   LDLCALC 71 04/11/2013   LDLDIRECT 66.4 01/09/2013   TRIG 137.0 04/11/2013   CHOLHDL 3 04/11/2013   Lab Results  Component Value Date   ALT 41 08/29/2013   ALT 41 08/29/2013   AST 22 08/29/2013   AST 22 08/29/2013   ALKPHOS 71 08/29/2013   ALKPHOS 71 08/29/2013   BILITOT 0.9 08/29/2013   BILITOT 0.9 08/29/2013       Other abnormal blood chemistry  Serologies for viral hepatitis , iron overload and autoimmune hepatitis were checked due to recurrent ALT elevations and were normal.      Obesity (BMI 30-39.9) I have addressed  BMI and recommended a low glycemic index diet utilizing smaller more frequent meals to increase metabolism.  I have also recommended that patient start exercising with a goal of 30 minutes of aerobic exercise a minimum of 5 days per week.     Updated Medication List Outpatient Encounter Prescriptions as of 08/29/2013  Medication Sig  . aspirin 81 MG tablet Take 81 mg by mouth daily.  Marland Kitchen atorvastatin (LIPITOR) 40 MG tablet TAKE 1 TABLET BY MOUTH DAILY.  Marland Kitchen clopidogrel (PLAVIX) 75 MG tablet TAKE 1 TABLET BY MOUTH EVERY DAY  . enalapril (VASOTEC) 2.5 MG tablet TAKE 1 TABLET (2.5 MG TOTAL) BY MOUTH 2 (TWO) TIMES DAILY.  Marland Kitchen glipiZIDE (GLUCOTROL) 5 MG tablet TAKE 1 TABLET BY MOUTH TWICE A DAY  . metFORMIN (GLUCOPHAGE) 500 MG tablet TAKE 1 TABLET (500 MG TOTAL) BY MOUTH 2 (TWO) TIMES DAILY WITH A MEAL.  . metoprolol succinate (TOPROL-XL) 25 MG 24 hr tablet Take 0.5 tablets (12.5 mg total) by mouth daily.     Orders Placed This Encounter  Procedures  . Comp Met (CMET)  . Ferritin  . Hemoglobin A1c  . Hepatic function panel    No Follow-up on file.

## 2013-09-05 LAB — HM DIABETES EYE EXAM

## 2013-09-05 LAB — HM DIABETES FOOT EXAM: HM DIABETIC FOOT EXAM: NORMAL

## 2013-09-16 ENCOUNTER — Telehealth: Payer: Self-pay

## 2013-09-16 LAB — HM DIABETES EYE EXAM

## 2013-09-16 NOTE — Telephone Encounter (Signed)
Relevant patient education assigned to patient using Emmi. ° °

## 2013-11-05 ENCOUNTER — Other Ambulatory Visit: Payer: Self-pay | Admitting: Internal Medicine

## 2013-11-15 ENCOUNTER — Encounter: Payer: Self-pay | Admitting: Internal Medicine

## 2013-11-15 ENCOUNTER — Ambulatory Visit (INDEPENDENT_AMBULATORY_CARE_PROVIDER_SITE_OTHER): Payer: BC Managed Care – PPO | Admitting: Internal Medicine

## 2013-11-15 VITALS — BP 124/76 | HR 54 | Temp 97.9°F | Wt 261.0 lb

## 2013-11-15 DIAGNOSIS — M7701 Medial epicondylitis, right elbow: Secondary | ICD-10-CM

## 2013-11-15 DIAGNOSIS — M7700 Medial epicondylitis, unspecified elbow: Secondary | ICD-10-CM

## 2013-11-15 NOTE — Patient Instructions (Addendum)
Medial Epicondylitis (Golfer's Elbow) with Rehab Medial epicondylitis involves inflammation and pain around the inner (medial) portion of the elbow. This pain is caused by inflammation of the tendons in the forearm that flex (bring down) the wrist. Medial epicondylitis is also called golfer's elbow, because it is common among golfers. However, it may occur in any individual who flexes the wrist regularly. If medial epicondylitis is left untreated, it may become a chronic problem. SYMPTOMS   Pain, tenderness, or inflammation over the inner (medial) side of the elbow.  Pain or weakness with gripping activities.  Pain that increases with wrist twisting motions (using a screwdriver, playing golf, bowling). CAUSES  Medial epicondylitis is caused by inflammation of the tendons that flex the wrist. Causes of injury may include:  Chronic, repetitive stress and strain to the tendons that run from the wrist and forearm to the elbow.  Sudden strain on the forearm, including wrist snap when serving balls with racquet sports, or throwing a baseball. RISK INCREASES WITH:  Sports or occupations that require repetitive and/or strenuous forearm and wrist movements (pitching a baseball, golfing, carpentry).  Poor wrist and forearm strength and flexibility.  Failure to warm up properly before activity.  Resuming activity before healing, rehabilitation, and conditioning are complete. PREVENTION   Warm up and stretch properly before activity.  Maintain physical fitness:  Strength, flexibility, and endurance.  Cardiovascular fitness.  Wear and use properly fitted equipment.  Learn and use proper technique and have a coach correct improper technique.  Wear a tennis elbow (counterforce) brace. PROGNOSIS  The course of this condition depends on the degree of the injury. If treated properly, acute cases (symptoms lasting less than 4 weeks) are often resolved in 2 to 6 weeks. Chronic (longer lasting  cases) often resolve in 3 to 6 months, but may require physical therapy. RELATED COMPLICATIONS   Frequently recurring symptoms, resulting in a chronic problem. Properly treating the problem the first time decreases frequency of recurrence.  Chronic inflammation, scarring, and partial tendon tear, requiring surgery.  Delayed healing or resolution of symptoms. TREATMENT  Treatment first involves the use of ice and medicine, to reduce pain and inflammation. Strengthening and stretching exercises may reduce discomfort, if performed regularly. These exercises may be performed at home, if the condition is an acute injury. Chronic cases may require a referral to a physical therapist for evaluation and treatment. Your caregiver may advise a corticosteroid injection to help reduce inflammation. Rarely, surgery is needed. MEDICATION  If pain medicine is needed, nonsteroidal anti-inflammatory medicines (aspirin and ibuprofen), or other minor pain relievers (acetaminophen), are often advised.  Do not take pain medicine for 7 days before surgery.  Prescription pain relievers may be given, if your caregiver thinks they are needed. Use only as directed and only as much as you need.  Corticosteroid injections may be recommended. These injections should be reserved only for the most severe cases, because they can only be given a certain number of times. HEAT AND COLD  Cold treatment (icing) should be applied for 10 to 15 minutes every 2 to 3 hours for inflammation and pain, and immediately after activity that aggravates your symptoms. Use ice packs or an ice massage.  Heat treatment may be used before performing stretching and strengthening activities prescribed by your caregiver, physical therapist, or athletic trainer. Use a heat pack or a warm water soak. SEEK MEDICAL CARE IF: Symptoms get worse or do not improve in 2 weeks, despite treatment. EXERCISES  RANGE OF MOTION (  ROM) AND STRETCHING EXERCISES -  Epicondylitis, Medial (Golfer's Elbow) These exercises may help you when beginning to rehabilitate your injury. Your symptoms may go away with or without further involvement from your physician, physical therapist or athletic trainer. While completing these exercises, remember:   Restoring tissue flexibility helps normal motion to return to the joints. This allows healthier, less painful movement and activity.  An effective stretch should be held for at least 30 seconds.  A stretch should never be painful. You should only feel a gentle lengthening or release in the stretched tissue. RANGE OF MOTION - Wrist Flexion, Active-Assisted  Extend your right / left elbow with your fingers pointing down.*  Gently pull the back of your hand towards you, until you feel a gentle stretch on the top of your forearm.  Hold this position for __________ seconds. Repeat __________ times. Complete this exercise __________ times per day.  *If directed by your physician, physical therapist or athletic trainer, complete this stretch with your elbow bent, rather than extended. RANGE OF MOTION - Wrist Extension, Active-Assisted  Extend your right / left elbow and turn your palm upwards.*  Gently pull your palm and fingertips back, so your wrist extends and your fingers point more toward the ground.  You should feel a gentle stretch on the inside of your forearm.  Hold this position for __________ seconds. Repeat __________ times. Complete this exercise __________ times per day. *If directed by your physician, physical therapist or athletic trainer, complete this stretch with your elbow bent, rather than extended. STRETCH - Wrist Extension   Place your right / left fingertips on a tabletop leaving your elbow slightly bent. Your fingers should point backwards.  Gently press your fingers and palm down onto the table, by straightening your elbow. You should feel a stretch on the inside of your forearm.  Hold  this position for __________ seconds. Repeat __________ times. Complete this stretch __________ times per day.  STRENGTHENING EXERCISES - Epicondylitis, Medial (Golfer's Elbow) These exercises may help you when beginning to rehabilitate your injury. They may resolve your symptoms with or without further involvement from your physician, physical therapist or athletic trainer. While completing these exercises, remember:   Muscles can gain both the endurance and the strength needed for everyday activities through controlled exercises.  Complete these exercises as instructed by your physician, physical therapist or athletic trainer. Increase the resistance and repetitions only as guided.  You may experience muscle soreness or fatigue, but the pain or discomfort you are trying to eliminate should never worsen during these exercises. If this pain does get worse, stop and make sure you are following the directions exactly. If the pain is still present after adjustments, discontinue the exercise until you can discuss the trouble with your caregiver. STRENGTH - Wrist Flexors  Sit with your right / left forearm palm-up, and fully supported on a table or countertop. Your elbow should be resting below the height of your shoulder. Allow your wrist to extend over the edge of the surface.  Loosely holding a __________ weight, or a piece of rubber exercise band or tubing, slowly curl your hand up toward your forearm.  Hold this position for __________ seconds. Slowly lower the wrist back to the starting position in a controlled manner. Repeat __________ times. Complete this exercise __________ times per day.  STRENGTH - Wrist Extensors  Sit with your right / left forearm palm-down and fully supported. Your elbow should be resting below the height of your shoulder.   Allow your wrist to extend over the edge of the surface.  Loosely holding a __________ weight, or a piece of rubber exercise band or tubing, slowly  curl your hand up toward your forearm.  Hold this position for __________ seconds. Slowly lower the wrist back to the starting position in a controlled manner. Repeat __________ times. Complete this exercise __________ times per day.  STRENGTH - Ulnar Deviators  Stand with a ____________________ weight in your right / left hand, or sit while holding a rubber exercise band or tubing, with your healthy arm supported on a table or countertop.  Move your wrist so that your pinkie travels toward your forearm and your thumb moves away from your forearm.  Hold this position for __________ seconds and then slowly lower the wrist back to the starting position. Repeat __________ times. Complete this exercise __________ times per day STRENGTH - Grip   Grasp a tennis ball, a dense sponge, or a large, rolled sock in your hand.  Squeeze as hard as you can, without increasing any pain.  Hold this position for __________ seconds. Release your grip slowly. Repeat __________ times. Complete this exercise __________ times per day.  STRENGTH - Forearm Supinators   Sit with your right / left forearm supported on a table, keeping your elbow below shoulder height. Rest your hand over the edge, palm down.  Gently grip a hammer or a soup ladle.  Without moving your elbow, slowly turn your palm and hand upward to a "thumbs-up" position.  Hold this position for __________ seconds. Slowly return to the starting position. Repeat __________ times. Complete this exercise __________ times per day.  STRENGTH - Forearm Pronators  Sit with your right / left forearm supported on a table, keeping your elbow below shoulder height. Rest your hand over the edge, palm up.  Gently grip a hammer or a soup ladle.  Without moving your elbow, slowly turn your palm and hand upward to a "thumbs-up" position.  Hold this position for __________ seconds. Slowly return to the starting position. Repeat __________ times. Complete  this exercise __________ times per day.  Document Released: 05/16/2005 Document Revised: 08/08/2011 Document Reviewed: 08/28/2008 [employer] Patient Information 2015 [employer]. This information is not intended to replace advice given to you by your health care provider. Make sure you discuss any questions you have with your health care provider.  

## 2013-11-15 NOTE — Progress Notes (Signed)
Pre visit review using our clinic review tool, if applicable. No additional management support is needed unless otherwise documented below in the visit note. 

## 2013-11-15 NOTE — Progress Notes (Signed)
Subjective:    Patient ID: Todd Jenkins, male    DOB: 01-Aug-1960, 53 y.o.   MRN: [id number]  HPI  Pt presents to the clinic today with c/o right arm pain. This started 4 days ago after playing golf over the weekend. He reports that the inside of his elbow is sore. It does feel stiff as well. He has tried [employer] with some relief. He denies chest pain, chest tightness or shortness of breath.  Review of Systems  Past Medical History  Diagnosis Date  . Coronary artery disease   . H/O non-ST elevation myocardial infarction (NSTEMI) July 2012    s/p PTCA/ DESstent of 100% occluded LAD  . Diabetes mellitus     type II  . Hyperlipidemia   . Hypertension     Current Outpatient Prescriptions  Medication Sig Dispense Refill  . aspirin 81 MG tablet Take 81 mg by mouth daily.      Marland Kitchen atorvastatin (LIPITOR) 40 MG tablet TAKE 1 TABLET BY MOUTH DAILY.  90 tablet  1  . clopidogrel (PLAVIX) 75 MG tablet TAKE 1 TABLET BY MOUTH EVERY DAY  90 tablet  1  . enalapril (VASOTEC) 2.5 MG tablet TAKE 1 TABLET BY MOUTH TWICE A DAY  180 tablet  1  . glipiZIDE (GLUCOTROL) 5 MG tablet TAKE 1 TABLET BY MOUTH TWICE A DAY  180 tablet  1  . metFORMIN (GLUCOPHAGE) 500 MG tablet TAKE 1 TABLET (500 MG TOTAL) BY MOUTH 2 (TWO) TIMES DAILY WITH A MEAL.  180 tablet  1  . metoprolol succinate (TOPROL-XL) 25 MG 24 hr tablet Take 0.5 tablets (12.5 mg total) by mouth daily.  90 tablet  1   No current facility-administered medications for this visit.    No Known Allergies  Family History  Problem Relation Age of Onset  . Cancer Mother     liver  . Diabetes Mother   . Heart disease Mother   . Cancer Father     colon  . Hypertension Father     History   Social History  . Marital Status: Single    Spouse Name: N/A    Number of Children: N/A  . Years of Education: N/A   Occupational History  . Not on file.   Social History Main Topics  . Smoking status: Former Smoker -- 8 years    Types: Cigars     Quit date: 07/11/2010  . Smokeless tobacco: Never Used  . Alcohol Use: No  . Drug Use: No  . Sexual Activity: Not on file   Other Topics Concern  . Not on file   Social History Narrative  . No narrative on file     Constitutional: Denies fever, malaise, fatigue, headache or abrupt weight changes.  Musculoskeletal: Pt reports right arm pain. Denies difficulty with gait, muscle pain or joint swelling.    No other specific complaints in a complete review of systems (except as listed in HPI above).     Objective:   Physical Exam  BP 124/76  Pulse 54  Temp(Src) 97.9 F (36.6 C) (Oral)  Wt 261 lb (118.389 kg)  SpO2 98% Wt Readings from Last 3 Encounters:  11/15/13 261 lb (118.389 kg)  08/29/13 260 lb (117.935 kg)  04/11/13 261 lb 4 oz (118.502 kg)    General: Appears his stated age, well developed, well nourished in NAD. Cardiovascular: Normal rate and rhythm. S1,S2 noted.  No murmur, rubs or gallops noted. No JVD or BLE  edema. No carotid bruits noted. Pulmonary/Chest: Normal effort and positive vesicular breath sounds. No respiratory distress. No wheezes, rales or ronchi noted.  Musculoskeletal: Tenderness to palpation of the medial epicondyle. Normal flexion, extension, and rotation of the elbow. Strength 5/5 BUE.  BMET    Component Value Date/Time   NA 138 08/29/2013 1510   K 3.9 08/29/2013 1510   CL 103 08/29/2013 1510   CO2 26 08/29/2013 1510   GLUCOSE 223* 08/29/2013 1510   BUN 17 08/29/2013 1510   CREATININE 0.84 08/29/2013 1510   CREATININE 0.9 04/11/2013 0920   CALCIUM 9.4 08/29/2013 1510   GFRNONAA >89 12/08/2011 0913   GFRAA >89 12/08/2011 0913    Lipid Panel     Component Value Date/Time   CHOL 147 04/11/2013 0920   TRIG 137.0 04/11/2013 0920   HDL 48.70 04/11/2013 0920   CHOLHDL 3 04/11/2013 0920   VLDL 27.4 04/11/2013 0920   LDLCALC 71 04/11/2013 0920    CBC    Component Value Date/Time   WBC 6.4 04/11/2013 0920   RBC 5.00 04/11/2013 0920   HGB 14.6  04/11/2013 0920   HCT 42.5 04/11/2013 0920   PLT 212.0 04/11/2013 0920   MCV 84.9 04/11/2013 0920   MCHC 34.5 04/11/2013 0920   RDW 14.0 04/11/2013 0920   LYMPHSABS 1.7 04/11/2013 0920   MONOABS 0.6 04/11/2013 0920   EOSABS 0.2 04/11/2013 0920   BASOSABS 0.0 04/11/2013 0920    Hgb A1C Lab Results  Component Value Date   HGBA1C 6.9* 08/29/2013         Assessment & Plan:   Golfers elbow, right arm:  Try a golfers elbow brace that can be purchased from your local drug store Ice may help Ok to take Aleve prn for pain/inflammation  RTC as needed or if symptoms persist or worsen

## 2013-12-05 ENCOUNTER — Encounter: Payer: Self-pay | Admitting: Internal Medicine

## 2013-12-05 ENCOUNTER — Ambulatory Visit (INDEPENDENT_AMBULATORY_CARE_PROVIDER_SITE_OTHER): Payer: BC Managed Care – PPO | Admitting: Internal Medicine

## 2013-12-05 VITALS — BP 120/78 | HR 79 | Temp 98.5°F | Ht 72.0 in | Wt 260.5 lb

## 2013-12-05 DIAGNOSIS — E1169 Type 2 diabetes mellitus with other specified complication: Secondary | ICD-10-CM

## 2013-12-05 DIAGNOSIS — E119 Type 2 diabetes mellitus without complications: Secondary | ICD-10-CM

## 2013-12-05 DIAGNOSIS — E669 Obesity, unspecified: Secondary | ICD-10-CM

## 2013-12-05 DIAGNOSIS — E785 Hyperlipidemia, unspecified: Secondary | ICD-10-CM

## 2013-12-05 DIAGNOSIS — Z79899 Other long term (current) drug therapy: Secondary | ICD-10-CM

## 2013-12-05 NOTE — Progress Notes (Signed)
Pre visit review using our clinic review tool, if applicable. No additional management support is needed unless otherwise documented below in the visit note. 

## 2013-12-08 NOTE — Assessment & Plan Note (Signed)
Well controlled on current statin therapy.   Liver enzymes are stable , no changes today.  Lab Results  Component Value Date   CHOL 147 04/11/2013   HDL 48.70 04/11/2013   LDLCALC 71 04/11/2013   LDLDIRECT 66.4 01/09/2013   TRIG 137.0 04/11/2013   CHOLHDL 3 04/11/2013   Lab Results  Component Value Date   ALT 41 08/29/2013   ALT 41 08/29/2013   AST 22 08/29/2013   AST 22 08/29/2013   ALKPHOS 71 08/29/2013   ALKPHOS 71 08/29/2013   BILITOT 0.9 08/29/2013   BILITOT 0.9 08/29/2013

## 2013-12-08 NOTE — Progress Notes (Signed)
Patient ID: Todd Jenkins, male   DOB: [birth year], 53 y.o.   MRN: [id number]  Patient Active Problem List   Diagnosis Date Noted  . Other abnormal blood chemistry 01/10/2013  . Special screening for malignant neoplasm of prostate 12/08/2011  . Routine general medical examination at a health care facility 12/08/2011  . Skin lesion of left arm 12/08/2011  . Obesity (BMI 30-39.9) 12/08/2011  . Diabetes mellitus type 2 in obese 09/11/2011  . Hyperlipidemia with target LDL less than 70 09/11/2011  . Coronary artery disease     Subjective:  CC:   Chief Complaint  Patient presents with  . Follow-up    HPI:   Todd Jenkins is a 53 y.o. male who presents for 3 month follow up on Type 2 DM, t diagnosis,  Hypertension, hyperlipidemia and obesity. He has not had any significant weight loss since last visit but has reduced the refined sugar in her diet.  He does not check his sugars regularly , has nad no episodes of hypoglycemia or or chest pain.  He golfs once or twice  per week for exercise.  Taking meds as directed without side effects.   Past Medical History  Diagnosis Date  . Coronary artery disease   . H/O non-ST elevation myocardial infarction (NSTEMI) July 2012    s/p PTCA/ DESstent of 100% occluded LAD  . Diabetes mellitus     type II  . Hyperlipidemia   . Hypertension     Past Surgical History  Procedure Laterality Date  . Cardiac catheterization  July 2012  . 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    Review of Systems:   Patient denies headache, fevers, malaise, unintentional weight loss, skin rash, eye pain, sinus congestion and sinus pain, sore throat, dysphagia,  hemoptysis , cough, dyspnea, wheezing, chest pain, palpitations, orthopnea, edema, abdominal pain, nausea,  melena, diarrhea, constipation, flank pain, dysuria, hematuria, urinary  Frequency, nocturia, numbness, tingling, seizures,  Focal weakness, Loss of consciousness,  Tremor, insomnia, depression, anxiety, and suicidal ideation.     History   Social History  . Marital Status: Single    Spouse Name: N/A    Number of Children: N/A  . Years of Education: N/A   Occupational History  . Not on file.   Social History Main Topics  . Smoking status: Former Smoker -- 8 years    Types: Cigars    Quit date: 07/11/2010  . Smokeless tobacco: Never Used  . Alcohol Use: No  . Drug Use: No  . Sexual Activity: Not on file   Other Topics Concern  . Not on file   Social History Narrative  . No narrative on file    Objective:  Filed Vitals:   12/05/13 0824  BP: 120/78  Pulse: 79  Temp: 98.5 F (36.9 C)     General appearance: alert, cooperative and appears stated age Ears: normal TM's and external ear canals both ears Throat: lips, mucosa, and tongue normal; teeth and gums normal Neck: no adenopathy, no carotid bruit, supple, symmetrical, trachea midline and thyroid not enlarged, symmetric, no tenderness/mass/nodules Back: symmetric, no curvature. ROM normal. No CVA tenderness. Lungs: clear to auscultation bilaterally Heart: regular rate and rhythm, S1, S2 normal, no murmur, click, rub or gallop Abdomen: soft, non-tender;  bowel sounds normal; no masses,  no organomegaly Pulses: 2+ and symmetric Skin: Skin color, texture, turgor normal. No rashes or lesions Lymph nodes: Cervical, supraclavicular, and axillary nodes normal.  Assessment and Plan:  Diabetes mellitus type 2 in obese Historically well-controlled on current medications.  hemoglobin A1c has been consistently at or  less than 7.0 . Patient is up-to-date on eye exams and foot exam is normal today. Patient  Has microalbuminuria. . Patient is tolerating statin therapy for CAD risk reduction and on ACE/ARB for reduction in  proteinuria.  Lab Results  Component Value Date   HGBA1C 6.9* 08/29/2013      Lab Results  Component Value Date   MICROALBUR 3.3* 04/11/2013       Hyperlipidemia with target LDL less than 27 Well controlled on current statin therapy.   Liver enzymes are stable , no changes today.  Lab Results  Component Value Date   CHOL 147 04/11/2013   HDL 48.70 04/11/2013   LDLCALC 71 04/11/2013   LDLDIRECT 66.4 01/09/2013   TRIG 137.0 04/11/2013   CHOLHDL 3 04/11/2013   Lab Results  Component Value Date   ALT 41 08/29/2013   ALT 41 08/29/2013   AST 22 08/29/2013   AST 22 08/29/2013   ALKPHOS 71 08/29/2013   ALKPHOS 71 08/29/2013   BILITOT 0.9 08/29/2013   BILITOT 0.9 08/29/2013         Obesity (BMI 30-39.9) I have addressed  BMI and recommended wt loss of 10% of body weigh over the next 6 months using a low glycemic index diet and regular exercise a minimum of 5 days per week.    A total of 25 minutes was spent with patient more than half of which was spent in counseling, reviewing recordsand coordination of care.   Updated Medication List Outpatient Encounter Prescriptions as of 12/05/2013  Medication Sig  . aspirin 81 MG tablet Take 81 mg by mouth daily.  Marland Kitchen atorvastatin (LIPITOR) 40 MG tablet TAKE 1 TABLET BY MOUTH DAILY.  Marland Kitchen clopidogrel (PLAVIX) 75 MG tablet TAKE 1 TABLET BY MOUTH EVERY DAY  . enalapril (VASOTEC) 2.5 MG tablet TAKE 1 TABLET BY MOUTH TWICE A DAY  . glipiZIDE (GLUCOTROL) 5 MG tablet TAKE 1 TABLET BY MOUTH TWICE A DAY  . metFORMIN (GLUCOPHAGE) 500 MG tablet TAKE 1 TABLET (500 MG TOTAL) BY MOUTH 2 (TWO) TIMES DAILY WITH A MEAL.  . metoprolol succinate (TOPROL-XL) 25 MG 24 hr tablet Take 0.5 tablets (12.5 mg total) by mouth daily.     Orders Placed This Encounter  Procedures  . Hemoglobin A1c  . Lipid panel  . Comprehensive metabolic panel  . HM DIABETES EYE EXAM  . HM DIABETES FOOT EXAM    No Follow-up on file.

## 2013-12-08 NOTE — Assessment & Plan Note (Signed)
I have addressed  BMI and recommended wt loss of 10% of body weigh over the next 6 months using a low glycemic index diet and regular exercise a minimum of 5 days per week.   

## 2013-12-08 NOTE — Assessment & Plan Note (Signed)
Historically well-controlled on current medications.  hemoglobin A1c has been consistently at or  less than 7.0 . Patient is up-to-date on eye exams and foot exam is normal today. Patient  Has microalbuminuria. . Patient is tolerating statin therapy for CAD risk reduction and on ACE/ARB for reduction in proteinuria.  Lab Results  Component Value Date   HGBA1C 6.9* 08/29/2013      Lab Results  Component Value Date   MICROALBUR 3.3* 04/11/2013

## 2013-12-09 ENCOUNTER — Other Ambulatory Visit: Payer: Self-pay | Admitting: *Deleted

## 2013-12-09 MED ORDER — METFORMIN HCL 500 MG PO TABS: ORAL_TABLET | ORAL | Status: DC

## 2013-12-13 ENCOUNTER — Other Ambulatory Visit (INDEPENDENT_AMBULATORY_CARE_PROVIDER_SITE_OTHER): Payer: BC Managed Care – PPO

## 2013-12-13 DIAGNOSIS — E119 Type 2 diabetes mellitus without complications: Secondary | ICD-10-CM

## 2013-12-13 DIAGNOSIS — E785 Hyperlipidemia, unspecified: Secondary | ICD-10-CM

## 2013-12-13 DIAGNOSIS — Z79899 Other long term (current) drug therapy: Secondary | ICD-10-CM

## 2013-12-13 LAB — LIPID PANEL
CHOLESTEROL: 159 mg/dL (ref 0–200)
HDL: 48.3 mg/dL (ref >39.00)
LDL Cholesterol: 62 mg/dL (ref 0–99)
NonHDL: 110.7
Total CHOL/HDL Ratio: 3
Triglycerides: 246 mg/dL — ABNORMAL HIGH (ref 0.0–149.0)
VLDL: 49.2 mg/dL — AB (ref 0.0–40.0)

## 2013-12-13 LAB — COMPREHENSIVE METABOLIC PANEL
ALT: 54 U/L — AB (ref 0–53)
AST: 29 U/L (ref 0–37)
Albumin: 3.7 g/dL (ref 3.5–5.2)
Alkaline Phosphatase: 64 U/L (ref 39–117)
BILIRUBIN TOTAL: 0.8 mg/dL (ref 0.2–1.2)
BUN: 20 mg/dL (ref 6–23)
CALCIUM: 8.9 mg/dL (ref 8.4–10.5)
CHLORIDE: 104 meq/L (ref 96–112)
CO2: 26 meq/L (ref 19–32)
Creatinine, Ser: 1 mg/dL (ref 0.4–1.5)
GFR: 85.76 mL/min (ref >60.00)
GLUCOSE: 183 mg/dL — AB (ref 70–99)
Potassium: 4.3 mEq/L (ref 3.5–5.1)
Sodium: 138 mEq/L (ref 135–145)
Total Protein: 6.5 g/dL (ref 6.0–8.3)

## 2013-12-13 LAB — HEMOGLOBIN A1C: HEMOGLOBIN A1C: 8.3 % — AB (ref 4.6–6.5)

## 2013-12-14 ENCOUNTER — Encounter: Payer: Self-pay | Admitting: Internal Medicine

## 2013-12-14 MED ORDER — GLIPIZIDE 10 MG PO TABS: 10.0000 mg | ORAL_TABLET | Freq: Two times a day (BID) | ORAL | Status: DC

## 2013-12-14 MED ORDER — METFORMIN HCL 850 MG PO TABS: 850.0000 mg | ORAL_TABLET | Freq: Two times a day (BID) | ORAL | Status: DC

## 2013-12-14 NOTE — Addendum Note (Signed)
Addended by: Crecencio Mc on: 12/14/2013 05:07 PM   Modules accepted: Orders, Medications

## 2014-01-24 ENCOUNTER — Encounter: Payer: Self-pay | Admitting: Internal Medicine

## 2014-01-24 MED ORDER — GLUCOSE BLOOD VI STRP: ORAL_STRIP | Status: DC

## 2014-02-18 ENCOUNTER — Telehealth: Payer: Self-pay | Admitting: Internal Medicine

## 2014-02-18 ENCOUNTER — Encounter: Payer: Self-pay | Admitting: Internal Medicine

## 2014-02-18 NOTE — Telephone Encounter (Signed)
Open by mistake

## 2014-03-05 ENCOUNTER — Other Ambulatory Visit: Payer: Self-pay | Admitting: *Deleted

## 2014-03-05 MED ORDER — GLIPIZIDE 10 MG PO TABS: 10.0000 mg | ORAL_TABLET | Freq: Two times a day (BID) | ORAL | Status: DC

## 2014-03-14 ENCOUNTER — Other Ambulatory Visit: Payer: Self-pay | Admitting: Internal Medicine

## 2014-04-16 ENCOUNTER — Encounter: Payer: Self-pay | Admitting: Internal Medicine

## 2014-04-16 ENCOUNTER — Ambulatory Visit (INDEPENDENT_AMBULATORY_CARE_PROVIDER_SITE_OTHER): Payer: BC Managed Care – PPO | Admitting: Internal Medicine

## 2014-04-16 VITALS — BP 134/80 | HR 55 | Temp 98.7°F | Resp 16 | Ht 72.5 in | Wt 261.0 lb

## 2014-04-16 DIAGNOSIS — R809 Proteinuria, unspecified: Secondary | ICD-10-CM

## 2014-04-16 DIAGNOSIS — E119 Type 2 diabetes mellitus without complications: Secondary | ICD-10-CM

## 2014-04-16 DIAGNOSIS — Z0000 Encounter for general adult medical examination without abnormal findings: Secondary | ICD-10-CM

## 2014-04-16 DIAGNOSIS — Z125 Encounter for screening for malignant neoplasm of prostate: Secondary | ICD-10-CM

## 2014-04-16 DIAGNOSIS — E1169 Type 2 diabetes mellitus with other specified complication: Secondary | ICD-10-CM

## 2014-04-16 DIAGNOSIS — E669 Obesity, unspecified: Secondary | ICD-10-CM

## 2014-04-16 DIAGNOSIS — M75100 Unspecified rotator cuff tear or rupture of unspecified shoulder, not specified as traumatic: Secondary | ICD-10-CM

## 2014-04-16 DIAGNOSIS — E785 Hyperlipidemia, unspecified: Secondary | ICD-10-CM

## 2014-04-16 DIAGNOSIS — Z23 Encounter for immunization: Secondary | ICD-10-CM

## 2014-04-16 DIAGNOSIS — E1129 Type 2 diabetes mellitus with other diabetic kidney complication: Secondary | ICD-10-CM

## 2014-04-16 LAB — COMPREHENSIVE METABOLIC PANEL
ALBUMIN: 4.3 g/dL (ref 3.5–5.2)
ALK PHOS: 72 U/L (ref 39–117)
ALT: 39 U/L (ref 0–53)
AST: 23 U/L (ref 0–37)
BILIRUBIN TOTAL: 1.1 mg/dL (ref 0.2–1.2)
BUN: 19 mg/dL (ref 6–23)
CO2: 24 meq/L (ref 19–32)
Calcium: 9.6 mg/dL (ref 8.4–10.5)
Chloride: 108 mEq/L (ref 96–112)
Creatinine, Ser: 1 mg/dL (ref 0.4–1.5)
GFR: 87.74 mL/min (ref >60.00)
Glucose, Bld: 175 mg/dL — ABNORMAL HIGH (ref 70–99)
Potassium: 4.6 mEq/L (ref 3.5–5.1)
Sodium: 146 mEq/L — ABNORMAL HIGH (ref 135–145)
Total Protein: 7.2 g/dL (ref 6.0–8.3)

## 2014-04-16 LAB — LIPID PANEL
CHOL/HDL RATIO: 4
Cholesterol: 172 mg/dL (ref 0–200)
HDL: 46 mg/dL (ref >39.00)
NonHDL: 126
Triglycerides: 236 mg/dL — ABNORMAL HIGH (ref 0.0–149.0)
VLDL: 47.2 mg/dL — ABNORMAL HIGH (ref 0.0–40.0)

## 2014-04-16 LAB — LDL CHOLESTEROL, DIRECT: LDL DIRECT: 76.9 mg/dL

## 2014-04-16 LAB — MICROALBUMIN / CREATININE URINE RATIO
Creatinine,U: 58.5 mg/dL
MICROALB UR: 3.5 mg/dL — AB (ref 0.0–1.9)
Microalb Creat Ratio: 6 mg/g (ref 0.0–30.0)

## 2014-04-16 LAB — PSA: PSA: 0.43 ng/mL (ref 0.10–4.00)

## 2014-04-16 LAB — HEMOGLOBIN A1C: Hgb A1c MFr Bld: 7 % — ABNORMAL HIGH (ref 4.6–6.5)

## 2014-04-16 MED ORDER — VARDENAFIL HCL 10 MG PO TABS: 10.0000 mg | ORAL_TABLET | Freq: Every day | ORAL | Status: DC | PRN

## 2014-04-16 MED ORDER — TADALAFIL 5 MG PO TABS: 5.0000 mg | ORAL_TABLET | Freq: Every day | ORAL | Status: DC | PRN

## 2014-04-16 NOTE — Progress Notes (Signed)
Patient ID: Todd Jenkins, male   DOB: 1961-02-29, 53 y.o.   MRN: [id number]   The patient is here for his annual male physical examination and management of other chronic and acute problems, including Type 2 DM with microalbuminuria, CAD, hyperlipidemia, hypertension and obesity.    Last OV was July 12 at which time his DM had become uncontrolled due to dietary indiscretions    He has been having some recurrent bilateral shoulder pain with golf..  No recent injury,  History of rotator cuff tear in college. Using motrin, tylenol.      The risk factors are reflected in the social history.  The roster of all physicians providing medical care to patient - is listed in the Snapshot section of the chart.  Activities of daily living:  The patient is 100% independent in all ADLs: dressing, toileting, feeding as well as independent mobility  Home safety : The patient has smoke detectors in the home. He wears seatbelts.  There are no firearms at home. There is no violence in the home.   There is no risks for hepatitis, STDs or HIV. There is no   history of blood transfusion. There is no travel history to infectious disease endemic areas of the world.  The patient has seen their dentist in the last six month and  their eye doctor in the last year.  They do not  have excessive sun exposure. They have seen a dermatoloigist in the last year. Discussed the need for sun protection: hats, long sleeves and use of sunscreen if there is significant sun exposure.   Diet: the importance of a healthy diet is discussed. He eats out several nights per week due to his travel schedule.  The benefits of regular aerobic exercise were discussed. He is not exercising regularly, has not lost weight.    Depression screen: there are no signs or vegative symptoms of depression- irritability, change in appetite, anhedonia, sadness/tearfullness.  The following portions of the patient's history were reviewed and updated  as appropriate: allergies, current medications, past family history, past medical history,  past surgical history, past social history  and problem list.  Visual acuity was not assessed per patient preference since he has regular follow up with his ophthalmologist and has no evidence of retinopathy by last visit April 2015.   Hearing and body mass index were assessed and reviewed.   During the course of the visit the patient was educated and counseled about appropriate screening and preventive services including :  nutrition counseling, colorectal cancer screening, and recommended immunizations.    Objective:  BP 134/80 mmHg  Pulse 55  Temp(Src) 98.7 F (37.1 C) (Oral)  Resp 16  Ht 6' 0.5" (1.842 m)  Wt 261 lb (118.389 kg)  BMI 34.89 kg/m2  SpO2 99%  General Appearance:    Alert, cooperative, no distress, appears stated age  Head:    Normocephalic, without obvious abnormality, atraumatic  Eyes:    PERRL, conjunctiva/corneas clear, EOM's intact, fundi    benign, both eyes       Ears:    Normal TM's and external ear canals, both ears  Nose:   Nares normal, septum midline, mucosa normal, no drainage   or sinus tenderness  Throat:   Lips, mucosa, and tongue normal; teeth and gums normal  Neck:   Supple, symmetrical, trachea midline, no adenopathy;       thyroid:  No enlargement/tenderness/nodules; no carotid   bruit or JVD  Back:  Symmetric, no curvature, ROM normal, no CVA tenderness  Lungs:     Clear to auscultation bilaterally, respirations unlabored  Chest wall:    No tenderness or deformity  Heart:    Regular rate and rhythm, S1 and S2 normal, no murmur, rub   or gallop  Abdomen:     Soft, non-tender, bowel sounds active all four quadrants,    no masses, no organomegaly  Genitalia:    Normal male without lesion, discharge or tenderness  Rectal:    Normal tone, normal prostate, no masses or tenderness;   guaiac negative stool  Extremities:   Extremities normal, atraumatic,  no cyanosis or edema  Pulses:   2+ and symmetric all extremities  Skin:   Skin color, texture, turgor normal, no rashes or lesions  Lymph nodes:   Cervical, supraclavicular, and axillary nodes normal  Neurologic:   CNII-XII intact. 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  there has been a slight increase in the amount of protein seen in urine.   Will increase the lisinopril to 5 mg daily.  Lab Results  Component Value Date   HGBA1C 7.0* 04/16/2014   Lab Results  Component Value Date   CHOL 172 04/16/2014   HDL 46.00 04/16/2014   LDLCALC 62 12/13/2013   LDLDIRECT 76.9 04/16/2014   TRIG 236.0* 04/16/2014   CHOLHDL 4 04/16/2014   Lab Results  Component Value Date   MICROALBUR 3.5* 04/16/2014     Rotator cuff syndrome Chronic tendinopathy due to remote injuries and overuse.   Hyperlipidemia with target LDL less than 70 Well controlled on current statin therapy.   Liver enzymes are normal , no changes today.  Lab Results  Component Value Date   CHOL 172 04/16/2014   HDL 46.00 04/16/2014   LDLCALC 62 12/13/2013   LDLDIRECT 76.9 04/16/2014   TRIG 236.0* 04/16/2014   CHOLHDL 4 04/16/2014   Lab Results  Component Value Date   ALT 39 04/16/2014   AST 23 04/16/2014   ALKPHOS 72 04/16/2014   BILITOT 1.1 04/16/2014     Routine general medical examination at a health care facility  Annual male comprehensive exam was done . All screenings and immunizations have been addressed and a printed health maintenance schedule was given to patient.    Obesity (BMI 30-39.9) I have addressed  BMI and recommended a low glycemic index diet  utilizing smaller more frequent meals to increase metabolism.  I have also recommended that patient start exercising with a goal of 30 minutes of aerobic exercise a minimum of 5 days per week.    Updated Medication List Outpatient Encounter Prescriptions as of 04/16/2014  Medication Sig  . aspirin 81 MG tablet Take 81 mg by mouth daily.  Marland Kitchen atorvastatin (LIPITOR) 40 MG tablet TAKE 1 TABLET BY MOUTH DAILY.  Marland Kitchen clopidogrel (PLAVIX) 75 MG tablet TAKE 1 TABLET BY MOUTH EVERY DAY  . enalapril (VASOTEC) 5 MG tablet Take 1 tablet (5 mg total) by mouth 2 (two) times daily.  Marland Kitchen glipiZIDE (GLUCOTROL) 10 MG tablet Take 1 tablet (10 mg total) by mouth 2 (two)  times daily before a meal. TAKE 1 TABLET BY MOUTH TWICE A DAY  . glucose blood (ONE TOUCH ULTRA TEST) test strip Use as instructed  . metFORMIN (GLUCOPHAGE) 850 MG tablet Take 1 tablet (850 mg total) by mouth 2 (two) times daily with a meal.  . metoprolol succinate (TOPROL-XL) 25 MG 24 hr tablet Take 0.5 tablets (12.5 mg total) by mouth daily.  . [DISCONTINUED] enalapril (VASOTEC) 2.5 MG tablet TAKE 1 TABLET BY MOUTH TWICE A DAY  . tadalafil (CIALIS) 5 MG tablet Take 1 tablet (5 mg total) by mouth daily as needed for erectile dysfunction.  . vardenafil (LEVITRA) 10 MG tablet Take 1 tablet (10 mg total) by mouth daily as needed for erectile dysfunction.

## 2014-04-16 NOTE — Patient Instructions (Signed)
You had your annual  wellness exam today.    You received the new pneumonia (Prevnar)  Vaccine today.  If you develop a sore arm , use tylenol and ibuprofen for a few days     We will contact you with the bloodwork results

## 2014-04-16 NOTE — Progress Notes (Signed)
Pre-visit discussion using our clinic review tool. No additional management support is needed unless otherwise documented below in the visit note.  

## 2014-04-18 ENCOUNTER — Telehealth: Payer: Self-pay | Admitting: *Deleted

## 2014-04-18 NOTE — Telephone Encounter (Signed)
Fax from [facility], needing PA for Levitra. Started online, pending response.  

## 2014-04-19 ENCOUNTER — Encounter: Payer: Self-pay | Admitting: Internal Medicine

## 2014-04-19 MED ORDER — ENALAPRIL MALEATE 5 MG PO TABS: 5.0000 mg | ORAL_TABLET | Freq: Two times a day (BID) | ORAL | Status: DC

## 2014-04-19 NOTE — Assessment & Plan Note (Signed)
I have addressed  BMI and recommended a low glycemic index diet utilizing smaller more frequent meals to increase metabolism.  I have also recommended that patient start exercising with a goal of 30 minutes of aerobic exercise a minimum of 5 days per week.  

## 2014-04-19 NOTE — Addendum Note (Signed)
Addended by: Crecencio Mc on: 04/19/2014 07:41 PM   Modules accepted: Level of Service

## 2014-04-19 NOTE — Assessment & Plan Note (Addendum)
diabetes  Control  Has improved and  A1c is now 7.0  cholesterol is at goal on your current dose of atorvastatin, and liver enzymes are normal.  there has been a slight increase in the amount of protein seen in urine.   Will increase the lisinopril to 5 mg daily.  Lab Results  Component Value Date   HGBA1C 7.0* 04/16/2014   Lab Results  Component Value Date   CHOL 172 04/16/2014   HDL 46.00 04/16/2014   LDLCALC 62 12/13/2013   LDLDIRECT 76.9 04/16/2014   TRIG 236.0* 04/16/2014   CHOLHDL 4 04/16/2014   Lab Results  Component Value Date   MICROALBUR 3.5* 04/16/2014

## 2014-04-19 NOTE — Assessment & Plan Note (Signed)
Well controlled on current statin therapy.   Liver enzymes are normal , no changes today.  Lab Results  Component Value Date   CHOL 172 04/16/2014   HDL 46.00 04/16/2014   LDLCALC 62 12/13/2013   LDLDIRECT 76.9 04/16/2014   TRIG 236.0* 04/16/2014   CHOLHDL 4 04/16/2014   Lab Results  Component Value Date   ALT 39 04/16/2014   AST 23 04/16/2014   ALKPHOS 72 04/16/2014   BILITOT 1.1 04/16/2014

## 2014-04-19 NOTE — Assessment & Plan Note (Signed)
  Annual male comprehensive exam was done . All screenings and immunizations have been addressed and a printed health maintenance schedule was given to patient.

## 2014-04-19 NOTE — Assessment & Plan Note (Signed)
Chronic tendinopathy due to remote injuries and overuse.

## 2014-04-25 ENCOUNTER — Other Ambulatory Visit: Payer: Self-pay | Admitting: Internal Medicine

## 2014-06-06 ENCOUNTER — Other Ambulatory Visit: Payer: Self-pay | Admitting: Internal Medicine

## 2014-07-17 ENCOUNTER — Ambulatory Visit (INDEPENDENT_AMBULATORY_CARE_PROVIDER_SITE_OTHER): Payer: BLUE CROSS/BLUE SHIELD | Admitting: Internal Medicine

## 2014-07-17 ENCOUNTER — Encounter: Payer: Self-pay | Admitting: Internal Medicine

## 2014-07-17 VITALS — BP 124/80 | HR 53 | Temp 98.2°F | Resp 14 | Ht 73.0 in | Wt 264.0 lb

## 2014-07-17 DIAGNOSIS — E785 Hyperlipidemia, unspecified: Secondary | ICD-10-CM

## 2014-07-17 DIAGNOSIS — M75100 Unspecified rotator cuff tear or rupture of unspecified shoulder, not specified as traumatic: Secondary | ICD-10-CM

## 2014-07-17 DIAGNOSIS — E1169 Type 2 diabetes mellitus with other specified complication: Secondary | ICD-10-CM

## 2014-07-17 DIAGNOSIS — R21 Rash and other nonspecific skin eruption: Secondary | ICD-10-CM | POA: Insufficient documentation

## 2014-07-17 DIAGNOSIS — E119 Type 2 diabetes mellitus without complications: Secondary | ICD-10-CM

## 2014-07-17 DIAGNOSIS — R809 Proteinuria, unspecified: Secondary | ICD-10-CM

## 2014-07-17 DIAGNOSIS — E1129 Type 2 diabetes mellitus with other diabetic kidney complication: Secondary | ICD-10-CM

## 2014-07-17 DIAGNOSIS — E669 Obesity, unspecified: Secondary | ICD-10-CM

## 2014-07-17 DIAGNOSIS — M25512 Pain in left shoulder: Secondary | ICD-10-CM

## 2014-07-17 LAB — COMPREHENSIVE METABOLIC PANEL
ALT: 49 U/L (ref 0–53)
AST: 24 U/L (ref 0–37)
Albumin: 3.9 g/dL (ref 3.5–5.2)
Alkaline Phosphatase: 72 U/L (ref 39–117)
BUN: 20 mg/dL (ref 6–23)
CALCIUM: 9.4 mg/dL (ref 8.4–10.5)
CHLORIDE: 105 meq/L (ref 96–112)
CO2: 27 meq/L (ref 19–32)
Creatinine, Ser: 1 mg/dL (ref 0.40–1.50)
GFR: 82.62 mL/min (ref >60.00)
Glucose, Bld: 178 mg/dL — ABNORMAL HIGH (ref 70–99)
Potassium: 4.6 mEq/L (ref 3.5–5.1)
Sodium: 138 mEq/L (ref 135–145)
Total Bilirubin: 0.7 mg/dL (ref 0.2–1.2)
Total Protein: 6.8 g/dL (ref 6.0–8.3)

## 2014-07-17 LAB — LIPID PANEL
CHOL/HDL RATIO: 4
Cholesterol: 158 mg/dL (ref 0–200)
HDL: 39.8 mg/dL (ref >39.00)
NONHDL: 118.2
Triglycerides: 282 mg/dL — ABNORMAL HIGH (ref 0.0–149.0)
VLDL: 56.4 mg/dL — ABNORMAL HIGH (ref 0.0–40.0)

## 2014-07-17 LAB — LDL CHOLESTEROL, DIRECT: LDL DIRECT: 55 mg/dL

## 2014-07-17 LAB — HEMOGLOBIN A1C: HEMOGLOBIN A1C: 7 % — AB (ref 4.6–6.5)

## 2014-07-17 MED ORDER — SILDENAFIL CITRATE 100 MG PO TABS: 50.0000 mg | ORAL_TABLET | Freq: Every day | ORAL | Status: AC | PRN

## 2014-07-17 MED ORDER — NYSTATIN-TRIAMCINOLONE 100000-0.1 UNIT/GM-% EX OINT: 1.0000 "application " | TOPICAL_OINTMENT | Freq: Two times a day (BID) | CUTANEOUS | Status: DC

## 2014-07-17 NOTE — Progress Notes (Signed)
Pre-visit discussion using our clinic review tool. No additional management support is needed unless otherwise documented below in the visit note.  

## 2014-07-17 NOTE — Progress Notes (Signed)
Patient ID: Todd Jenkins, male   DOB: Oct 01, 1960, 54 y.o.   MRN: [id number]   Patient Active Problem List   Diagnosis Date Noted  . Rash and nonspecific skin eruption 07/22/2014  . Rash of neck 07/17/2014  . Rotator cuff syndrome 04/16/2014  . Other abnormal blood chemistry 01/10/2013  . Special screening for malignant neoplasm of prostate 12/08/2011  . Routine general medical examination at a health care facility 12/08/2011  . Skin lesion of left arm 12/08/2011  . Obesity (BMI 30-39.9) 12/08/2011  . 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 Hyperlipidemia with target LDL less than 70 09/11/2011  . Coronary artery disease     Subjective:  CC:   Chief Complaint  Patient presents with  . Follow-up  . Diabetes    HPI:   Todd Jenkins is a 54 y.o. male who presents for    3 month follow up on Type 2 DM,   Hypertension, hyperlipidemia and obesity.  he has gained several lbs since last visit  due to dietary indiscretions and lack of exercise over the holidays .  Fasting cbgs have been consistently under 130.    Wt Readings from Last 3 Encounters:  07/17/14 264 lb (119.75 kg)  04/16/14 261 lb (118.389 kg)  12/05/13 260 lb 8 oz (118.162 kg)   He has been having a pruritic rash on anterior neck for several months,  Not spreading or scaling.  Aggravated by sweating.  No new meds or topicals,  No contact with small children.  No history of bug bites.  Shoulders still hurting with history of priro rotato cuff syndrome.     Lab Results  Component Value Date   HGBA1C 7.0* 04/16/2014   Lab Results  Component Value Date   CHOL 172 04/16/2014   HDL 46.00 04/16/2014   LDLCALC 62 12/13/2013   LDLDIRECT 76.9 04/16/2014   TRIG 236.0* 04/16/2014   CHOLHDL 4 04/16/2014       Past Medical History  Diagnosis Date  . Coronary artery disease   . H/O non-ST elevation myocardial infarction (NSTEMI) July 2012    s/p PTCA/  DESstent of 100% occluded LAD  . Diabetes mellitus     type II  . Hyperlipidemia   . Hypertension     Past Surgical History  Procedure Laterality Date  . Cardiac catheterization  July 2012  . 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    Review of Systems:   Patient denies headache, fevers, malaise, unintentional weight loss, skin rash, eye pain, sinus congestion and sinus pain, sore throat, dysphagia,  hemoptysis , cough, dyspnea, wheezing, chest pain, palpitations, orthopnea, edema, abdominal pain, nausea, melena, diarrhea, constipation, flank pain, dysuria, hematuria, urinary  Frequency, nocturia, numbness, tingling, seizures,  Focal weakness, Loss of consciousness,  Tremor, insomnia, depression, anxiety, and suicidal ideation.     History   Social History  . Marital Status: Single    Spouse Name: N/A  . Number of Children: N/A  . Years of Education: N/A   Occupational History  . Not on file.   Social History Main Topics  . Smoking status: Former Smoker -- 8 years    Types: Cigars    Quit date: 07/11/2010  . Smokeless tobacco: Never Used  .  Alcohol Use: No  . Drug Use: No  . Sexual Activity: Not on file   Other Topics Concern  . Not on file   Social History Narrative    Objective:  Filed Vitals:   07/17/14 0809  BP: 124/80  Pulse: 53  Temp: 98.2 F (36.8 C)  Resp: 14     General appearance: alert, cooperative and appears stated age Ears: normal TM's and external ear canals both ears Throat: lips, mucosa, and tongue normal; teeth and gums normal Neck: no adenopathy, no carotid bruit, supple, symmetrical, trachea midline and thyroid not enlarged, symmetric, no tenderness/mass/nodules Back: symmetric, no curvature. ROM normal. No CVA tenderness. Lungs: clear to auscultation  bilaterally Heart: regular rate and rhythm, S1, S2 normal, no murmur, click, rub or gallop Abdomen: soft, non-tender; bowel sounds normal; no masses,  no organomegaly Pulses: 2+ and symmetric Skin: Skin color, texture, turgor normal. No rashes or lesions Lymph nodes: Cervical, supraclavicular, and axillary nodes normal.  Assessment and Plan:  Rash of neck Aggravated by sweating.  No new meds or topicals.  Will treat with nystatin/triamcinolone.    Obesity (BMI 30-39.9) I have addressed  BMI > 30 and recommended a low glycemic index diet utilizing smaller more frequent meals to increase metabolism.  I have also recommended that patient start exercising with a goal of 30 minutes of aerobic exercise a minimum of 5 days per week.      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  hemoglobin A1c has been consistently at or  less than 7.0 . Patient is up-to-date on eye exams and foot exam is normal today. Patient is due for urine microalbumin to creatinine ratio at next visit. Patient is tolerating statin therapy for CAD risk reduction and on ACE/ARB for reduction in proteinuria.    Lab Results  Component Value Date   HGBA1C 7.0* 07/17/2014   Lab Results  Component Value Date   MICROALBUR 3.5* 04/16/2014   Lab Results  Component Value Date   CHOL 158 07/17/2014   HDL 39.80 07/17/2014   LDLCALC 62 12/13/2013   LDLDIRECT 55.0 07/17/2014   TRIG 282.0* 07/17/2014   CHOLHDL 4 07/17/2014      Hyperlipidemia with target LDL less than 42 Well controlled on current statin therapy.   Liver enzymes are normal , no changes today.  Lab Results  Component Value Date   CHOL 158 07/17/2014   HDL 39.80 07/17/2014   LDLCALC 62 12/13/2013   LDLDIRECT 55.0 07/17/2014   TRIG 282.0* 07/17/2014   CHOLHDL 4 07/17/2014   Lab Results  Component Value Date   ALT 49 07/17/2014   AST 24 07/17/2014   ALKPHOS 72 07/17/2014    BILITOT 0.7 07/17/2014      Rotator cuff syndrome Trial of Aleve for current symptoms.  Referral to Sports medicine.    A total of 25 minutes of face to face time was spent with patient more than half of which was spent in counselling on the above mentioned issues.  Updated Medication List Outpatient Encounter Prescriptions as of 07/17/2014  Medication Sig  . aspirin 81 MG tablet Take 81 mg by mouth daily.  Marland Kitchen atorvastatin (LIPITOR) 40 MG tablet TAKE 1 TABLET BY MOUTH DAILY.  Marland Kitchen clopidogrel (PLAVIX) 75 MG tablet TAKE 1 TABLET BY MOUTH EVERY DAY  . enalapril (VASOTEC) 5 MG tablet Take 1 tablet (5 mg total) by mouth 2 (two) times daily.  Marland Kitchen glipiZIDE (GLUCOTROL) 10 MG tablet  Take 1 tablet (10 mg total) by mouth 2 (two) times daily before a meal. TAKE 1 TABLET BY MOUTH TWICE A DAY  . glucose blood (ONE TOUCH ULTRA TEST) test strip Use as instructed  . metFORMIN (GLUCOPHAGE) 850 MG tablet TAKE 1 TABLET (850 MG TOTAL) BY MOUTH 2 (TWO) TIMES DAILY WITH A MEAL.  . metoprolol succinate (TOPROL-XL) 25 MG 24 hr tablet TAKE 1/2 TABLET BY MOUTH DAILY.  Marland Kitchen nystatin-triamcinolone ointment (MYCOLOG) Apply 1 application topically 2 (two) times daily.  . sildenafil (VIAGRA) 100 MG tablet Take 0.5-1 tablets (50-100 mg total) by mouth daily as needed for erectile dysfunction.  . [DISCONTINUED] atorvastatin (LIPITOR) 40 MG tablet TAKE 1 TABLET BY MOUTH DAILY.  . [DISCONTINUED] clopidogrel (PLAVIX) 75 MG tablet TAKE 1 TABLET BY MOUTH EVERY DAY  . [DISCONTINUED] tadalafil (CIALIS) 5 MG tablet Take 1 tablet (5 mg total) by mouth daily as needed for erectile dysfunction.  . [DISCONTINUED] vardenafil (LEVITRA) 10 MG tablet Take 1 tablet (10 mg total) by mouth daily as needed for erectile dysfunction.     Orders Placed This Encounter  Procedures  . Comprehensive metabolic panel  . Lipid panel  . Hemoglobin A1c  . LDL cholesterol, direct  . Ambulatory referral to Sports Medicine    No Follow-up on file.

## 2014-07-17 NOTE — Patient Instructions (Signed)
Nystatin /triamcinolone ointment twice daily for neck rash,  should improve after 1 week   Try  Aleve for shoulder

## 2014-07-22 ENCOUNTER — Encounter: Payer: Self-pay | Admitting: Internal Medicine

## 2014-07-22 DIAGNOSIS — R21 Rash and other nonspecific skin eruption: Secondary | ICD-10-CM | POA: Insufficient documentation

## 2014-07-22 NOTE — Assessment & Plan Note (Signed)
Aggravated by sweating.  No new meds or topicals.  Will treat with nystatin/triamcinolone.

## 2014-07-22 NOTE — Assessment & Plan Note (Signed)
Well controlled on current statin therapy.   Liver enzymes are normal , no changes today.  Lab Results  Component Value Date   CHOL 158 07/17/2014   HDL 39.80 07/17/2014   LDLCALC 62 12/13/2013   LDLDIRECT 55.0 07/17/2014   TRIG 282.0* 07/17/2014   CHOLHDL 4 07/17/2014   Lab Results  Component Value Date   ALT 49 07/17/2014   AST 24 07/17/2014   ALKPHOS 72 07/17/2014   BILITOT 0.7 07/17/2014

## 2014-07-22 NOTE — Assessment & Plan Note (Signed)
Remains  well-controlled on current medications.  hemoglobin A1c has been consistently at or  less than 7.0 . Patient is up-to-date on eye exams and foot exam is normal today. Patient is due for urine microalbumin to creatinine ratio at next visit. Patient is tolerating statin therapy for CAD risk reduction and on ACE/ARB for reduction in proteinuria.    Lab Results  Component Value Date   HGBA1C 7.0* 07/17/2014   Lab Results  Component Value Date   MICROALBUR 3.5* 04/16/2014   Lab Results  Component Value Date   CHOL 158 07/17/2014   HDL 39.80 07/17/2014   LDLCALC 62 12/13/2013   LDLDIRECT 55.0 07/17/2014   TRIG 282.0* 07/17/2014   CHOLHDL 4 07/17/2014

## 2014-07-22 NOTE — Telephone Encounter (Signed)
FYI

## 2014-07-22 NOTE — Assessment & Plan Note (Signed)
I have addressed  BMI > 30 and recommended a low glycemic index diet utilizing smaller more frequent meals to increase metabolism.  I have also recommended that patient start exercising with a goal of 30 minutes of aerobic exercise a minimum of 5 days per week.

## 2014-07-22 NOTE — Assessment & Plan Note (Signed)
Trial of Aleve for current symptoms.  Referral to Sports medicine.

## 2014-08-05 ENCOUNTER — Other Ambulatory Visit (INDEPENDENT_AMBULATORY_CARE_PROVIDER_SITE_OTHER): Payer: BLUE CROSS/BLUE SHIELD

## 2014-08-05 ENCOUNTER — Encounter: Payer: Self-pay | Admitting: Family Medicine

## 2014-08-05 ENCOUNTER — Ambulatory Visit (INDEPENDENT_AMBULATORY_CARE_PROVIDER_SITE_OTHER): Payer: BLUE CROSS/BLUE SHIELD | Admitting: Family Medicine

## 2014-08-05 VITALS — BP 138/80 | HR 58 | Ht 73.0 in | Wt 265.0 lb

## 2014-08-05 DIAGNOSIS — M25512 Pain in left shoulder: Secondary | ICD-10-CM

## 2014-08-05 DIAGNOSIS — M7552 Bursitis of left shoulder: Secondary | ICD-10-CM | POA: Diagnosis not present

## 2014-08-05 NOTE — Progress Notes (Signed)
[facility] [address], [place] [address] Phone: [phone] Subjective:    I'm seeing this patient by the request  of:  TULLO, Aris Everts, MD   CC: left shoulder pain  [id number] Todd Jenkins is a 54 y.o. male coming in with complaint of left shoulder pain. Patient has had some pain for quite some time now. Patient states he's had a past [place] history significant for rotator cuff syndrome.patient states this feel somewhat similar. Patient denies any radiation down the arm but states that certain movements especially when reaching behind his back or across his body. Patient is an avid golfer and has had difficulty because of this. Patient states due to the pain he feels like he is having some weakness. Still can do daily activities. Patient's that he has no pain with certain movements but unfortunately those other ones are severe pain 6 out of 10. Patient denies any nighttime awakening or any neck pain associated with it. Patient has tried over-the-counter medications with minimal improvement.     Past medical history, social, surgical and family history all reviewed in electronic medical record.   Review of Systems: No headache, visual changes, nausea, vomiting, diarrhea, constipation, dizziness, abdominal pain, skin rash, fevers, chills, night sweats, weight loss, swollen lymph nodes, body aches, joint swelling, muscle aches, chest pain, shortness of breath, mood changes.   Objective Blood pressure 138/80, pulse 58, height 6\' 1"  (1.854 m), weight 265 lb (120.203 kg), SpO2 97 %.  General: No apparent distress alert and oriented x3 mood and affect normal, dressed appropriately.  HEENT: Pupils equal, extraocular movements intact  Respiratory: Patient's speak in full sentences and does not appear short of breath  Cardiovascular: No lower extremity edema, non tender, no erythema  Skin: Warm dry intact with no signs of infection or rash on  extremities or on axial skeleton.  Abdomen: Soft nontender  Neuro: Cranial nerves II through XII are intact, neurovascularly intact in all extremities with 2+ DTRs and 2+ pulses.  Lymph: No lymphadenopathy of posterior or anterior cervical chain or axillae bilaterally.  Gait normal with good balance and coordination.  MSK:  Non tender with full range of motion and good stability and symmetric strength and tone of  elbows, wrist, hip, knee and ankles bilaterally.  Shoulder: left Inspection reveals no abnormalities, atrophy or asymmetry. Palpation is normal with no tenderness over AC joint or bicipital groove. ROM is full in all planes passively. Rotator cuff strength normal throughout. signs of impingement with positive Neer and Hawkin's tests, but negative empty can sign. Speeds and Yergason's tests normal. No labral pathology noted with negative Obrien's, negative clunk and good stability. Normal scapular function observed. No painful arc and no drop arm sign. No apprehension sign  MSK US performed of: left This study was ordered, performed, and interpreted by Charlann Boxer D.O.  Shoulder:   Supraspinatus:  Mild degenerative changes, Bursal bulge seen with shoulder abduction on impingement view. Infraspinatus:  Appears normal on long and transverse views. Significant increase in Doppler flow Subscapularis:  Appears normal on long and transverse views. Positive bursa Teres Minor:  Appears normal on long and transverse views. AC joint:  Capsule undistended, no geyser sign. Glenohumeral Joint:  Appears normal without effusion. Glenoid Labrum:  Intact without visualized tears. Biceps Tendon:  Appears normal on long and transverse views, no fraying of tendon, tendon located in intertubercular groove, no subluxation with shoulder internal or external rotation.  Impression: Subacromial bursitismild degenerative  changes of the rotator cuff   Procedure note 14481; 15 minutes spent for  Therapeutic exercises as stated in above notes.  This included exercises focusing on stretching, strengthening, with significant focus on eccentric aspects.   Proper technique shown and discussed handout in great detail with ATC.  All questions were discussed and answered.     Impression and Recommendations:     This case required medical decision making of moderate complexity.

## 2014-08-05 NOTE — Patient Instructions (Addendum)
Good to see you.   Ice 20 minutes 2 times daily. Usually after activity and before bed. Exercises 3 times a week.  Try pennsaid topically 2 times daily Vitamin D 2000 IU daily Consider turmeric 500mg  twice daily.  See me again in 3 weeks. If still in pain we can do injection.

## 2014-08-05 NOTE — Progress Notes (Signed)
Pre visit review using our clinic review tool, if applicable. No additional management support is needed unless otherwise documented below in the visit note. 

## 2014-08-05 NOTE — Assessment & Plan Note (Signed)
No tear appreciated  Discussed icing and HEP, discussed PT which patient declined.  Worked with ATC today. Patient given topical anti-inflammtory, avoid oral use. Discussed icing protocol. Patient and will come back and see me again in 3 weeks. If continuing to have difficulty will consider injection. We may also need to be adamant about formal physical therapy.

## 2014-08-07 ENCOUNTER — Other Ambulatory Visit: Payer: Self-pay | Admitting: *Deleted

## 2014-08-07 MED ORDER — GLIPIZIDE 10 MG PO TABS: 10.0000 mg | ORAL_TABLET | Freq: Two times a day (BID) | ORAL | Status: DC

## 2014-08-26 ENCOUNTER — Encounter: Payer: Self-pay | Admitting: Family Medicine

## 2014-08-26 ENCOUNTER — Ambulatory Visit (INDEPENDENT_AMBULATORY_CARE_PROVIDER_SITE_OTHER): Payer: BLUE CROSS/BLUE SHIELD | Admitting: Family Medicine

## 2014-08-26 ENCOUNTER — Other Ambulatory Visit (INDEPENDENT_AMBULATORY_CARE_PROVIDER_SITE_OTHER): Payer: BLUE CROSS/BLUE SHIELD

## 2014-08-26 VITALS — BP 130/84 | HR 54 | Ht 73.0 in | Wt 266.0 lb

## 2014-08-26 DIAGNOSIS — M25512 Pain in left shoulder: Secondary | ICD-10-CM

## 2014-08-26 DIAGNOSIS — M7552 Bursitis of left shoulder: Secondary | ICD-10-CM

## 2014-08-26 NOTE — Progress Notes (Signed)
[facility] [address] [place], [place] [address] Phone: [phone] Subjective:     CC: left shoulder pain follow up  [id number] Todd Jenkins is a 54 y.o. male coming in with complaint of left shoulder pain. Patient was seen previously and did have a subacromial bursitis with some degenerative changes of the shoulder. Patient's was given home exercises, topical anti-inflammatory's, icing protocol. Patient states that he has been able to play golf and is approximate 40-50% better. Patient states though unfortunately he still can wake him up at night. Still affecting some of his daily activities. Denies any numbness or tingling. Respond somewhat to anti-inflammatories.    Past medical history, social, surgical and family history all reviewed in electronic medical record.   Review of Systems: No headache, visual changes, nausea, vomiting, diarrhea, constipation, dizziness, abdominal pain, skin rash, fevers, chills, night sweats, weight loss, swollen lymph nodes, body aches, joint swelling, muscle aches, chest pain, shortness of breath, mood changes.   Objective Blood pressure 130/84, pulse 54, height 6\' 1"  (1.854 m), weight 266 lb (120.657 kg), SpO2 98 %.  General: No apparent distress alert and oriented x3 mood and affect normal, dressed appropriately.  HEENT: Pupils equal, extraocular movements intact  Respiratory: Patient's speak in full sentences and does not appear short of breath  Cardiovascular: No lower extremity edema, non tender, no erythema  Skin: Warm dry intact with no signs of infection or rash on extremities or on axial skeleton.  Abdomen: Soft nontender  Neuro: Cranial nerves II through XII are intact, neurovascularly intact in all extremities with 2+ DTRs and 2+ pulses.  Lymph: No lymphadenopathy of posterior or anterior cervical chain or axillae bilaterally.  Gait normal with good balance and coordination.  MSK:  Non tender with  full range of motion and good stability and symmetric strength and tone of  elbows, wrist, hip, knee and ankles bilaterally.  Shoulder: left Inspection reveals no abnormalities, atrophy or asymmetry. Palpation is normal with no tenderness over AC joint or bicipital groove. ROM is full in all planes passively. Rotator cuff strength normal throughout. signs of impingement with positive Neer and Hawkin's tests, but negative empty can sign. Speeds and Yergason's tests normal. No labral pathology noted with negative Obrien's, negative clunk and good stability. Normal scapular function observed. No painful arc and no drop arm sign. No apprehension sign  MSK US performed of: left This study was ordered, performed, and interpreted by Charlann Boxer D.O.  Shoulder:   Supraspinatus:  Mild degenerative changes, Bursal bulge seen with shoulder abduction on impingement view. Infraspinatus:  Appears normal on long and transverse views. Significant increase in Doppler flow Subscapularis:  Appears normal on long and transverse views. Positive bursa Teres Minor:  Appears normal on long and transverse views. AC joint:  Capsule undistended, no geyser sign. Glenohumeral Joint:  Appears normal without effusion. Glenoid Labrum:  Intact without visualized tears. Biceps Tendon:  Appears normal on long and transverse views, no fraying of tendon, tendon located in intertubercular groove, no subluxation with shoulder internal or external rotation.  Impression: Subacromial bursitismild degenerative changes of the rotator cuff No change  Procedure: Real-time Ultrasound Guided Injection of left glenohumeral joint Device: GE Logiq E  Ultrasound guided injection is preferred based studies that show increased duration, increased effect, greater accuracy, decreased procedural pain, increased response rate with ultrasound guided versus blind injection.  Verbal informed consent obtained.  Time-out conducted.  Noted no  overlying erythema, induration, or other signs  of local infection.  Skin prepped in a sterile fashion.  Local anesthesia: Topical Ethyl chloride.  With sterile technique and under real time ultrasound guidance:  Joint visualized.  23g 1  inch needle inserted posterior approach. Pictures taken for needle placement. Patient did have injection of 2 cc of 1% lidocaine, 2 cc of 0.5% Marcaine, and 1cc of Kenalog 40 mg/dL. Completed without difficulty  Pain immediately resolved suggesting accurate placement of the medication.  Advised to call if fevers/chills, erythema, induration, drainage, or persistent bleeding.  Images permanently stored and available for review in the ultrasound unit.  Impression: Technically successful ultrasound guided injection.        Impression and Recommendations:     This case required medical decision making of moderate complexity.

## 2014-08-26 NOTE — Assessment & Plan Note (Signed)
Patient was given an injection today and tolerated the procedure very well. Patient was pain free at the end of this procedure. We discussed icing regimen and continuing conservative therapy. We did discuss formal physical therapy which patient declined. Patient will continue with this and come back and see me again in 3-4 weeks for further evaluation. No restrictions at this time.

## 2014-08-26 NOTE — Patient Instructions (Signed)
Good to see you Ice 20 minutes 2 times daily. Usually after activity and before bed. Continue the exercises 3 times a week.  Vitamins continue See me again in 4 weeks after your trip.

## 2014-08-26 NOTE — Progress Notes (Signed)
Pre visit review using our clinic review tool, if applicable. No additional management support is needed unless otherwise documented below in the visit note. 

## 2014-09-30 ENCOUNTER — Ambulatory Visit: Payer: BLUE CROSS/BLUE SHIELD | Admitting: Family Medicine

## 2014-10-16 ENCOUNTER — Ambulatory Visit (INDEPENDENT_AMBULATORY_CARE_PROVIDER_SITE_OTHER): Payer: BLUE CROSS/BLUE SHIELD | Admitting: Internal Medicine

## 2014-10-16 ENCOUNTER — Encounter: Payer: Self-pay | Admitting: Internal Medicine

## 2014-10-16 VITALS — BP 126/78 | HR 51 | Temp 98.4°F | Resp 14 | Ht 73.0 in | Wt 255.5 lb

## 2014-10-16 DIAGNOSIS — H1010 Acute atopic conjunctivitis, unspecified eye: Secondary | ICD-10-CM

## 2014-10-16 DIAGNOSIS — E1129 Type 2 diabetes mellitus with other diabetic kidney complication: Secondary | ICD-10-CM

## 2014-10-16 DIAGNOSIS — R809 Proteinuria, unspecified: Secondary | ICD-10-CM

## 2014-10-16 DIAGNOSIS — E785 Hyperlipidemia, unspecified: Secondary | ICD-10-CM

## 2014-10-16 DIAGNOSIS — E669 Obesity, unspecified: Secondary | ICD-10-CM | POA: Diagnosis not present

## 2014-10-16 DIAGNOSIS — E119 Type 2 diabetes mellitus without complications: Secondary | ICD-10-CM | POA: Diagnosis not present

## 2014-10-16 LAB — LIPID PANEL
Cholesterol: 134 mg/dL (ref 0–200)
HDL: 43.2 mg/dL (ref >39.00)
LDL Cholesterol: 58 mg/dL (ref 0–99)
NonHDL: 90.8
Total CHOL/HDL Ratio: 3
Triglycerides: 166 mg/dL — ABNORMAL HIGH (ref 0.0–149.0)
VLDL: 33.2 mg/dL (ref 0.0–40.0)

## 2014-10-16 LAB — COMPREHENSIVE METABOLIC PANEL
ALK PHOS: 61 U/L (ref 39–117)
ALT: 29 U/L (ref 0–53)
AST: 19 U/L (ref 0–37)
Albumin: 4 g/dL (ref 3.5–5.2)
BUN: 17 mg/dL (ref 6–23)
CO2: 29 meq/L (ref 19–32)
CREATININE: 0.9 mg/dL (ref 0.40–1.50)
Calcium: 9.5 mg/dL (ref 8.4–10.5)
Chloride: 104 mEq/L (ref 96–112)
GFR: 93.21 mL/min (ref >60.00)
Glucose, Bld: 138 mg/dL — ABNORMAL HIGH (ref 70–99)
Potassium: 4.5 mEq/L (ref 3.5–5.1)
Sodium: 138 mEq/L (ref 135–145)
Total Bilirubin: 0.8 mg/dL (ref 0.2–1.2)
Total Protein: 6.6 g/dL (ref 6.0–8.3)

## 2014-10-16 LAB — HEMOGLOBIN A1C: Hgb A1c MFr Bld: 6.3 % (ref 4.6–6.5)

## 2014-10-16 LAB — MICROALBUMIN / CREATININE URINE RATIO
CREATININE, U: 134.1 mg/dL
MICROALB/CREAT RATIO: 1.3 mg/g (ref 0.0–30.0)
Microalb, Ur: 1.7 mg/dL (ref 0.0–1.9)

## 2014-10-16 MED ORDER — AZELASTINE HCL 0.05 % OP SOLN: 1.0000 [drp] | Freq: Two times a day (BID) | OPHTHALMIC | Status: DC

## 2014-10-16 NOTE — Progress Notes (Signed)
Subjective:  Patient ID: Todd Jenkins, male    DOB: [birth year]  Age: 54 y.o. 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 Diagnoses of Allergic conjunctivitis, unspecified laterality, Obesity (BMI 30-39.9), and Hyperlipidemia with target LDL less than 70 were also pertinent to this visit.  HPI Todd Jenkins presents for follow up on DM Type 2,  With obesity, Hyperlipidemia, and hypertension.   He feels generally well, is exercising several times per week and checking blood sugars once daily at variable times.  BS have been under 130 fasting and < 150 post prandially.  Denies any recent hypoglyemic events.  Taking his medications as directed. Following a carbohydrate modified diet 6 days per week. Denies numbness, burning and tingling of extremities. Appetite is good. He is having some persistent itching of the skin around his eyes ans well as his conjunctiva which is aggravated by outside activities.  ,    Outpatient Prescriptions Prior to Visit  Medication Sig Dispense Refill  . aspirin 81 MG tablet Take 81 mg by mouth daily.    Marland Kitchen atorvastatin (LIPITOR) 40 MG tablet TAKE 1 TABLET BY MOUTH DAILY. 90 tablet 1  . clopidogrel (PLAVIX) 75 MG tablet TAKE 1 TABLET BY MOUTH EVERY DAY 90 tablet 1  . enalapril (VASOTEC) 5 MG tablet Take 1 tablet (5 mg total) by mouth 2 (two) times daily. 180 tablet 1  . glipiZIDE (GLUCOTROL) 10 MG tablet Take 1 tablet (10 mg total) by mouth 2 (two) times daily before a meal. 180 tablet 1  . glucose blood (ONE TOUCH ULTRA TEST) test strip Use as instructed 100 each 12  . metFORMIN (GLUCOPHAGE) 850 MG tablet TAKE 1 TABLET (850 MG TOTAL) BY MOUTH 2 (TWO) TIMES DAILY WITH A MEAL. 180 tablet 1  . metoprolol succinate (TOPROL-XL) 25 MG 24 hr tablet TAKE 1/2 TABLET BY MOUTH DAILY. 90 tablet 1  . nystatin-triamcinolone ointment (MYCOLOG) Apply 1 application topically 2 (two) times  daily. 30 g 0  . sildenafil (VIAGRA) 100 MG tablet Take 0.5-1 tablets (50-100 mg total) by mouth daily as needed for erectile dysfunction. 5 tablet 11   No facility-administered medications prior to visit.    Review of Systems;  Patient denies headache, fevers, malaise, unintentional weight loss, skin rash, eye pain, sinus congestion and sinus pain, sore throat, dysphagia,  hemoptysis , cough, dyspnea, wheezing, chest pain, palpitations, orthopnea, edema, abdominal pain, nausea, melena, diarrhea, constipation, flank pain, dysuria, hematuria, urinary  Frequency, nocturia, numbness, tingling, seizures,  Focal weakness, Loss of consciousness,  Tremor, insomnia, depression, anxiety, and suicidal ideation.      Objective:  BP 126/78 mmHg  Pulse 51  Temp(Src) 98.4 F (36.9 C) (Oral)  Resp 14  Ht 6\' 1"  (1.854 m)  Wt 255 lb 8 oz (115.894 kg)  BMI 33.72 kg/m2  SpO2 98%  BP Readings from Last 3 Encounters:  10/16/14 126/78  08/26/14 130/84  08/05/14 138/80    Wt Readings from Last 3 Encounters:  10/16/14 255 lb 8 oz (115.894 kg)  08/26/14 266 lb (120.657 kg)  08/05/14 265 lb (120.203 kg)    General appearance: alert, cooperative and appears stated age Ears: normal TM's and external ear canals both ears Throat: lips, mucosa, and tongue normal; teeth and gums normal Neck: no adenopathy, no carotid bruit, supple, symmetrical, trachea midline and thyroid not enlarged, symmetric, no tenderness/mass/nodules Back: symmetric, no curvature. ROM normal. No CVA tenderness. Lungs: clear to auscultation  bilaterally Heart: regular rate and rhythm, S1, S2 normal, no murmur, click, rub or gallop Abdomen: soft, non-tender; bowel sounds normal; no masses,  no organomegaly Pulses: 2+ and symmetric Skin: Skin color, texture, turgor normal. No rashes or lesions Lymph nodes: Cervical, supraclavicular, and axillary nodes normal.  Lab Results  Component Value Date   HGBA1C 6.3 10/16/2014   HGBA1C  7.0* 07/17/2014   HGBA1C 7.0* 04/16/2014    Lab Results  Component Value Date   CREATININE 0.90 10/16/2014   CREATININE 1.00 07/17/2014   CREATININE 1.0 04/16/2014    Lab Results  Component Value Date   WBC 6.4 04/11/2013   HGB 14.6 04/11/2013   HCT 42.5 04/11/2013   PLT 212.0 04/11/2013   GLUCOSE 138* 10/16/2014   CHOL 134 10/16/2014   TRIG 166.0* 10/16/2014   HDL 43.20 10/16/2014   LDLDIRECT 55.0 07/17/2014   LDLCALC 58 10/16/2014   ALT 29 10/16/2014   AST 19 10/16/2014   NA 138 10/16/2014   K 4.5 10/16/2014   CL 104 10/16/2014   CREATININE 0.90 10/16/2014   BUN 17 10/16/2014   CO2 29 10/16/2014   TSH 2.42 12/08/2011   PSA 0.43 04/16/2014   HGBA1C 6.3 10/16/2014   MICROALBUR 1.7 10/16/2014    No results found.  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  hemoglobin A1c   less than 7.0 . Patient is up-to-date on eye exams and foot exam is normal today.  Patient is tolerating statin therapy for CAD risk reduction and on ACE/ARB for reduction in proteinuria.       Relevant Orders   Comprehensive metabolic panel (Completed)   Hemoglobin A1c (Completed)   Lipid panel (Completed)   Microalbumin / creatinine urine ratio (Completed)   Hyperlipidemia with target LDL less than 70    Well controlled on current statin therapy.   Liver enzymes are normal , no changes today.  Lab Results  Component Value Date   CHOL 134 10/16/2014   HDL 43.20 10/16/2014   LDLCALC 58 10/16/2014   LDLDIRECT 55.0 07/17/2014   TRIG 166.0* 10/16/2014   CHOLHDL 3 10/16/2014   Lab Results  Component Value Date   ALT 29 10/16/2014   AST 19 10/16/2014   ALKPHOS 61 10/16/2014   BILITOT 0.8 10/16/2014           Obesity (BMI 30-39.9)    I have congratulated him in reduction of   BMI and encouraged  Continued weight loss with goal of 10% of  body weigh over the next 6 months using a low glycemic index diet and regular exercise a minimum of 5 days per week.        Allergic conjunctivitis    Trial of azelastine eye drops          I am having Todd Jenkins start on azelastine. I am also having him maintain his aspirin, glucose blood, atorvastatin, enalapril, clopidogrel, metFORMIN, metoprolol succinate, sildenafil, nystatin-triamcinolone ointment, and glipiZIDE.  Meds ordered this encounter  Medications  . azelastine (OPTIVAR) 0.05 % ophthalmic solution    Sig: Place 1 drop into both eyes 2 (two) times daily.    Dispense:  6 mL    Refill:  12    There are no discontinued medications.  Follow-up: Return in about 3 months (around 01/16/2015) for follow up diabetes.   Franz Svec L,  MD

## 2014-10-16 NOTE — Patient Instructions (Signed)
Trial of antihistamine eye drops  Try Eucerin or Aveeno moisturizer  If no improvement i can prescribe a MIILD steroid ointment for eyelids  We may decrease glipizide to 5 mg depending on today's A1c

## 2014-10-16 NOTE — Progress Notes (Signed)
Pre-visit discussion using our clinic review tool. No additional management support is needed unless otherwise documented below in the visit note.  

## 2014-10-18 ENCOUNTER — Encounter: Payer: Self-pay | Admitting: Internal Medicine

## 2014-10-18 NOTE — Assessment & Plan Note (Signed)
I have congratulated him in reduction of   BMI and encouraged  Continued weight loss with goal of 10% of body weigh over the next 6 months using a low glycemic index diet and regular exercise a minimum of 5 days per week.   

## 2014-10-18 NOTE — Assessment & Plan Note (Signed)
Trial of azelastine eye drops

## 2014-10-18 NOTE — Assessment & Plan Note (Signed)
well-controlled on current medications.  hemoglobin A1c   less than 7.0 . Patient is up-to-date on eye exams and foot exam is normal today.  Patient is tolerating statin therapy for CAD risk reduction and on ACE/ARB for reduction in proteinuria.

## 2014-10-18 NOTE — Assessment & Plan Note (Signed)
Well controlled on current statin therapy.   Liver enzymes are normal , no changes today.  Lab Results  Component Value Date   CHOL 134 10/16/2014   HDL 43.20 10/16/2014   LDLCALC 58 10/16/2014   LDLDIRECT 55.0 07/17/2014   TRIG 166.0* 10/16/2014   CHOLHDL 3 10/16/2014   Lab Results  Component Value Date   ALT 29 10/16/2014   AST 19 10/16/2014   ALKPHOS 61 10/16/2014   BILITOT 0.8 10/16/2014

## 2014-11-02 ENCOUNTER — Other Ambulatory Visit: Payer: Self-pay | Admitting: Internal Medicine

## 2014-12-02 ENCOUNTER — Other Ambulatory Visit: Payer: Self-pay | Admitting: Internal Medicine

## 2014-12-03 ENCOUNTER — Other Ambulatory Visit: Payer: Self-pay | Admitting: Internal Medicine

## 2015-01-21 ENCOUNTER — Ambulatory Visit (INDEPENDENT_AMBULATORY_CARE_PROVIDER_SITE_OTHER): Payer: BLUE CROSS/BLUE SHIELD | Admitting: Internal Medicine

## 2015-01-21 ENCOUNTER — Encounter: Payer: Self-pay | Admitting: Internal Medicine

## 2015-01-21 VITALS — BP 120/70 | HR 48 | Temp 98.8°F | Resp 12 | Ht 73.0 in | Wt 256.0 lb

## 2015-01-21 DIAGNOSIS — E119 Type 2 diabetes mellitus without complications: Secondary | ICD-10-CM | POA: Diagnosis not present

## 2015-01-21 DIAGNOSIS — R809 Proteinuria, unspecified: Secondary | ICD-10-CM | POA: Diagnosis not present

## 2015-01-21 DIAGNOSIS — Z23 Encounter for immunization: Secondary | ICD-10-CM

## 2015-01-21 DIAGNOSIS — E669 Obesity, unspecified: Secondary | ICD-10-CM | POA: Diagnosis not present

## 2015-01-21 DIAGNOSIS — E785 Hyperlipidemia, unspecified: Secondary | ICD-10-CM

## 2015-01-21 DIAGNOSIS — R001 Bradycardia, unspecified: Secondary | ICD-10-CM | POA: Insufficient documentation

## 2015-01-21 DIAGNOSIS — I2510 Atherosclerotic heart disease of native coronary artery without angina pectoris: Secondary | ICD-10-CM

## 2015-01-21 DIAGNOSIS — E1129 Type 2 diabetes mellitus with other diabetic kidney complication: Secondary | ICD-10-CM

## 2015-01-21 LAB — LIPID PANEL
CHOL/HDL RATIO: 3
Cholesterol: 144 mg/dL (ref 0–200)
HDL: 42.6 mg/dL (ref >39.00)
NonHDL: 101.36
Triglycerides: 236 mg/dL — ABNORMAL HIGH (ref 0.0–149.0)
VLDL: 47.2 mg/dL — AB (ref 0.0–40.0)

## 2015-01-21 LAB — COMPREHENSIVE METABOLIC PANEL
ALBUMIN: 4 g/dL (ref 3.5–5.2)
ALT: 27 U/L (ref 0–53)
AST: 18 U/L (ref 0–37)
Alkaline Phosphatase: 57 U/L (ref 39–117)
BUN: 20 mg/dL (ref 6–23)
CHLORIDE: 103 meq/L (ref 96–112)
CO2: 28 meq/L (ref 19–32)
CREATININE: 0.92 mg/dL (ref 0.40–1.50)
Calcium: 9.5 mg/dL (ref 8.4–10.5)
GFR: 90.79 mL/min (ref >60.00)
Glucose, Bld: 122 mg/dL — ABNORMAL HIGH (ref 70–99)
Potassium: 4.6 mEq/L (ref 3.5–5.1)
SODIUM: 139 meq/L (ref 135–145)
Total Bilirubin: 0.8 mg/dL (ref 0.2–1.2)
Total Protein: 6.9 g/dL (ref 6.0–8.3)

## 2015-01-21 LAB — LDL CHOLESTEROL, DIRECT: Direct LDL: 53 mg/dL

## 2015-01-21 LAB — HM DIABETES FOOT EXAM: HM DIABETIC FOOT EXAM: NORMAL

## 2015-01-21 LAB — HEMOGLOBIN A1C: Hgb A1c MFr Bld: 6.5 % (ref 4.6–6.5)

## 2015-01-21 NOTE — Assessment & Plan Note (Signed)
well-controlled on current medications.  hemoglobin A1c   less than 7.0 . Patient is up-to-date on eye exams and foot exam is normal today.  Patient is tolerating statin therapy for CAD risk reduction and on ACE/ARB for reduction in proteinuria.   Lab Results  Component Value Date   HGBA1C 6.5 01/21/2015   Lab Results  Component Value Date   MICROALBUR 1.7 10/16/2014

## 2015-01-21 NOTE — Assessment & Plan Note (Signed)
I have congratulated him in reduction of   BMI and encouraged  Continued weight loss with goal of 10% of body weigh over the next 6 months using a low glycemic index diet and regular exercise a minimum of 5 days per week.   

## 2015-01-21 NOTE — Patient Instructions (Signed)
.  Your metoprolol is slowing your heart rate way down.  If you are feeling sluggish,  Suspend the metoprolol and see if the symptoms improve

## 2015-01-21 NOTE — Progress Notes (Signed)
Pre-visit discussion using our clinic review tool. No additional management support is needed unless otherwise documented below in the visit note.  

## 2015-01-21 NOTE — Assessment & Plan Note (Signed)
Well controlled on current statin therapy.   Liver enzymes are normal , no changes today.  Lab Results  Component Value Date   CHOL 144 01/21/2015   HDL 42.60 01/21/2015   LDLCALC 58 10/16/2014   LDLDIRECT 53.0 01/21/2015   TRIG 236.0* 01/21/2015   CHOLHDL 3 01/21/2015   Lab Results  Component Value Date   ALT 27 01/21/2015   AST 18 01/21/2015   ALKPHOS 57 01/21/2015   BILITOT 0.8 01/21/2015

## 2015-01-21 NOTE — Progress Notes (Signed)
Subjective:  Patient ID: Todd Jenkins, male    DOB: [birth year]  Age: 54 y.o. MRN: [id number]  CC: The primary encounter diagnosis was Obesity (BMI 30-39.9). 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  HPI Todd Jenkins presents for  He feels generally well, is exercising several times per week and checking blood sugars once daily at variable times.  BS have been under 130 fasting and < 150 post prandially.  Denies any recent hypoglyemic events.  Taking his medications as directed. Following a loosely carbohydrate modified diet 6 days per week. Denies numbness, burning and tingling of extremities. Appetite is good. Has been indulgent more often thatn anticipated due to [place] .  Golfing for exercise.    Outpatient Prescriptions Prior to Visit  Medication Sig Dispense Refill  . aspirin 81 MG tablet Take 81 mg by mouth daily.    Marland Kitchen atorvastatin (LIPITOR) 40 MG tablet TAKE 1 TABLET BY MOUTH DAILY. 90 tablet 1  . azelastine (OPTIVAR) 0.05 % ophthalmic solution Place 1 drop into both eyes 2 (two) times daily. 6 mL 12  . clopidogrel (PLAVIX) 75 MG tablet TAKE 1 TABLET BY MOUTH EVERY DAY 90 tablet 1  . enalapril (VASOTEC) 5 MG tablet TAKE 1 TABLET (5 MG TOTAL) BY MOUTH 2 (TWO) TIMES DAILY. 180 tablet 1  . glipiZIDE (GLUCOTROL) 10 MG tablet Take 1 tablet (10 mg total) by mouth 2 (two) times daily before a meal. 180 tablet 1  . glucose blood (ONE TOUCH ULTRA TEST) test strip Use as instructed 100 each 12  . metFORMIN (GLUCOPHAGE) 850 MG tablet TAKE 1 TABLET (850 MG TOTAL) BY MOUTH 2 (TWO) TIMES DAILY WITH A MEAL. 180 tablet 1  . metoprolol succinate (TOPROL-XL) 25 MG 24 hr tablet TAKE 1/2 TABLET BY MOUTH DAILY. 90 tablet 1    . nystatin-triamcinolone ointment (MYCOLOG) Apply 1 application topically 2 (two) times daily. 30 g 0  . sildenafil (VIAGRA) 100 MG tablet Take 0.5-1 tablets (50-100 mg total) by mouth daily as needed for erectile dysfunction. 5 tablet 11  . clopidogrel (PLAVIX) 75 MG tablet TAKE 1 TABLET BY MOUTH EVERY DAY 90 tablet 1   No facility-administered medications prior to visit.    Review of Systems;  Patient denies headache, fevers, malaise, unintentional weight loss, skin rash, eye pain, sinus congestion and sinus pain, sore throat, dysphagia,  hemoptysis , cough, dyspnea, wheezing, chest pain, palpitations, orthopnea, edema, abdominal pain, nausea, melena, diarrhea, constipation, flank pain, dysuria, hematuria, urinary  Frequency, nocturia, numbness, tingling, seizures,  Focal weakness, Loss of consciousness,  Tremor, insomnia, depression, anxiety, and suicidal ideation.      Objective:  BP 120/70 mmHg  Pulse 48  Temp(Src) 98.8 F (37.1 C) (Oral)  Resp 12  Ht 6\' 1"  (1.854 m)  Wt 256 lb (116.121 kg)  BMI 33.78 kg/m2  SpO2 98%  BP Readings from Last 3 Encounters:  01/21/15 120/70  10/16/14 126/78  08/26/14 130/84    Wt Readings from Last 3 Encounters:  01/21/15 256 lb (116.121 kg)  10/16/14 255 lb 8 oz (115.894 kg)  08/26/14 266 lb (120.657 kg)    General appearance: alert, cooperative and appears stated age Ears: normal TM's and external ear canals both ears Throat: lips, mucosa, and tongue normal; teeth and gums normal Neck:  no adenopathy, no carotid bruit, supple, symmetrical, trachea midline and thyroid not enlarged, symmetric, no tenderness/mass/nodules Back: symmetric, no curvature. ROM normal. No CVA tenderness. Lungs: clear to auscultation bilaterally Heart: regular rate and rhythm, S1, S2 normal, no murmur, click, rub or gallop Abdomen: soft, non-tender; bowel sounds normal; no masses,  no organomegaly Pulses: 2+ and symmetric Skin: Skin color, texture, turgor  normal. No rashes or lesions Lymph nodes: Cervical, supraclavicular, and axillary nodes normal.  Lab Results  Component Value Date   HGBA1C 6.5 01/21/2015   HGBA1C 6.3 10/16/2014   HGBA1C 7.0* 07/17/2014    Lab Results  Component Value Date   CREATININE 0.92 01/21/2015   CREATININE 0.90 10/16/2014   CREATININE 1.00 07/17/2014    Lab Results  Component Value Date   WBC 6.4 04/11/2013   HGB 14.6 04/11/2013   HCT 42.5 04/11/2013   PLT 212.0 04/11/2013   GLUCOSE 122* 01/21/2015   CHOL 144 01/21/2015   TRIG 236.0* 01/21/2015   HDL 42.60 01/21/2015   LDLDIRECT 53.0 01/21/2015   LDLCALC 58 10/16/2014   ALT 27 01/21/2015   AST 18 01/21/2015   NA 139 01/21/2015   K 4.6 01/21/2015   CL 103 01/21/2015   CREATININE 0.92 01/21/2015   BUN 20 01/21/2015   CO2 28 01/21/2015   TSH 2.42 12/08/2011   PSA 0.43 04/16/2014   HGBA1C 6.5 01/21/2015   MICROALBUR 1.7 10/16/2014    No results found.  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  hemoglobin A1c   less than 7.0 . Patient is up-to-date on eye exams and foot exam is normal today.  Patient is tolerating statin therapy for CAD risk reduction and on ACE/ARB for reduction in proteinuria.   Lab Results  Component Value Date   HGBA1C 6.5 01/21/2015   Lab Results  Component Value Date   MICROALBUR 1.7 10/16/2014         Relevant Orders   Hemoglobin A1c (Completed)   Comprehensive metabolic panel (Completed)   Hyperlipidemia with target LDL less than 70    Well controlled on current statin therapy.   Liver enzymes are normal , no changes today.  Lab Results  Component Value Date   CHOL 144 01/21/2015   HDL 42.60 01/21/2015   LDLCALC 58 10/16/2014   LDLDIRECT 53.0 01/21/2015   TRIG 236.0* 01/21/2015   CHOLHDL 3 01/21/2015   Lab Results  Component Value Date   ALT  27 01/21/2015   AST 18 01/21/2015   ALKPHOS 57 01/21/2015   BILITOT 0.8 01/21/2015             Obesity (BMI 30-39.9) - Primary    I have congratulated him in reduction of   BMI and encouraged  Continued weight loss with goal of 10% of body weigh over the next 6 months using a low glycemic index diet and regular exercise a minimum of 5 days per week.          Bradycardia, sinus    Secondary to metoprolol, advised to suspend for fatigue       Coronary artery disease   Relevant Orders   LDL cholesterol, direct (Completed)   Lipid panel (Completed)    Other Visit Diagnoses    Encounter for immunization           I am having Mr.  Theissen maintain his aspirin, glucose blood, clopidogrel, metoprolol succinate, sildenafil, nystatin-triamcinolone ointment, glipiZIDE, azelastine, enalapril, atorvastatin, and metFORMIN.  No orders of the defined types were placed in this encounter.    Medications Discontinued During This Encounter  Medication Reason  . clopidogrel (PLAVIX) 75 MG tablet Duplicate    Follow-up: Return in about 3 months (around 04/23/2015) for follow up diabetes.   Crecencio Mc, MD

## 2015-01-21 NOTE — Assessment & Plan Note (Signed)
Secondary to metoprolol, advised to suspend for fatigue

## 2015-01-25 ENCOUNTER — Encounter: Payer: Self-pay | Admitting: Internal Medicine

## 2015-02-26 ENCOUNTER — Other Ambulatory Visit: Payer: Self-pay

## 2015-02-26 MED ORDER — GLIPIZIDE 10 MG PO TABS: 10.0000 mg | ORAL_TABLET | Freq: Two times a day (BID) | ORAL | Status: DC

## 2015-02-27 ENCOUNTER — Other Ambulatory Visit: Payer: Self-pay | Admitting: Internal Medicine

## 2015-04-29 ENCOUNTER — Telehealth: Payer: Self-pay | Admitting: *Deleted

## 2015-04-29 ENCOUNTER — Ambulatory Visit (INDEPENDENT_AMBULATORY_CARE_PROVIDER_SITE_OTHER): Payer: BLUE CROSS/BLUE SHIELD | Admitting: Internal Medicine

## 2015-04-29 ENCOUNTER — Encounter: Payer: Self-pay | Admitting: Internal Medicine

## 2015-04-29 VITALS — BP 138/84 | HR 50 | Temp 98.5°F | Resp 12 | Ht 71.5 in | Wt 256.5 lb

## 2015-04-29 DIAGNOSIS — C44311 Basal cell carcinoma of skin of nose: Secondary | ICD-10-CM | POA: Diagnosis not present

## 2015-04-29 DIAGNOSIS — E119 Type 2 diabetes mellitus without complications: Secondary | ICD-10-CM

## 2015-04-29 DIAGNOSIS — Z125 Encounter for screening for malignant neoplasm of prostate: Secondary | ICD-10-CM | POA: Diagnosis not present

## 2015-04-29 DIAGNOSIS — Z800 Family history of malignant neoplasm of digestive organs: Secondary | ICD-10-CM

## 2015-04-29 DIAGNOSIS — Z113 Encounter for screening for infections with a predominantly sexual mode of transmission: Secondary | ICD-10-CM

## 2015-04-29 DIAGNOSIS — R809 Proteinuria, unspecified: Secondary | ICD-10-CM | POA: Diagnosis not present

## 2015-04-29 DIAGNOSIS — Z0000 Encounter for general adult medical examination without abnormal findings: Secondary | ICD-10-CM | POA: Diagnosis not present

## 2015-04-29 DIAGNOSIS — I2583 Coronary atherosclerosis due to lipid rich plaque: Secondary | ICD-10-CM

## 2015-04-29 DIAGNOSIS — I2510 Atherosclerotic heart disease of native coronary artery without angina pectoris: Secondary | ICD-10-CM

## 2015-04-29 DIAGNOSIS — E785 Hyperlipidemia, unspecified: Secondary | ICD-10-CM

## 2015-04-29 DIAGNOSIS — M7552 Bursitis of left shoulder: Secondary | ICD-10-CM

## 2015-04-29 DIAGNOSIS — E1129 Type 2 diabetes mellitus with other diabetic kidney complication: Secondary | ICD-10-CM

## 2015-04-29 DIAGNOSIS — L989 Disorder of the skin and subcutaneous tissue, unspecified: Secondary | ICD-10-CM

## 2015-04-29 LAB — COMPREHENSIVE METABOLIC PANEL
ALBUMIN: 4.2 g/dL (ref 3.5–5.2)
ALK PHOS: 71 U/L (ref 39–117)
ALT: 29 U/L (ref 0–53)
AST: 15 U/L (ref 0–37)
BILIRUBIN TOTAL: 0.9 mg/dL (ref 0.2–1.2)
BUN: 22 mg/dL (ref 6–23)
CALCIUM: 9.4 mg/dL (ref 8.4–10.5)
CHLORIDE: 106 meq/L (ref 96–112)
CO2: 25 mEq/L (ref 19–32)
CREATININE: 0.93 mg/dL (ref 0.40–1.50)
GFR: 89.58 mL/min (ref >60.00)
Glucose, Bld: 127 mg/dL — ABNORMAL HIGH (ref 70–99)
Potassium: 4.3 mEq/L (ref 3.5–5.1)
Sodium: 143 mEq/L (ref 135–145)
TOTAL PROTEIN: 6.9 g/dL (ref 6.0–8.3)

## 2015-04-29 LAB — LIPID PANEL
CHOLESTEROL: 144 mg/dL (ref 0–200)
HDL: 44.8 mg/dL (ref >39.00)
LDL CALC: 69 mg/dL (ref 0–99)
NonHDL: 99.36
Total CHOL/HDL Ratio: 3
Triglycerides: 154 mg/dL — ABNORMAL HIGH (ref 0.0–149.0)
VLDL: 30.8 mg/dL (ref 0.0–40.0)

## 2015-04-29 LAB — PSA: PSA: 0.4 ng/mL (ref 0.10–4.00)

## 2015-04-29 LAB — HEMOGLOBIN A1C: Hgb A1c MFr Bld: 6.4 % (ref 4.6–6.5)

## 2015-04-29 LAB — LDL CHOLESTEROL, DIRECT: Direct LDL: 61 mg/dL

## 2015-04-29 LAB — TSH: TSH: 3.83 u[IU]/mL (ref 0.35–4.50)

## 2015-04-29 MED ORDER — ENALAPRIL-HYDROCHLOROTHIAZIDE 10-25 MG PO TABS: 1.0000 | ORAL_TABLET | Freq: Every day | ORAL | Status: DC

## 2015-04-29 NOTE — Patient Instructions (Signed)
I am changing enalapril to enalapril/hct to take once daily in the morning This has a mild diuretic in it to help managed the fluid retention  Referral to Dr Rockey Situ,  Dr Evorn Gong (dermatology)  And dr Bary Castilla for colonoscopy in 2017  Health Maintenance, Male A healthy lifestyle and preventative care can promote health and wellness.  Maintain regular health, dental, and eye exams.  Eat a healthy diet. Foods like vegetables, fruits, whole grains, low-fat dairy products, and lean protein foods contain the nutrients you need and are low in calories. Decrease your intake of foods high in solid fats, added sugars, and salt. Get information about a proper diet from your health care provider, if necessary.  Regular physical exercise is one of the most important things you can do for your health. Most adults should get at least 150 minutes of moderate-intensity exercise (any activity that increases your heart rate and causes you to sweat) each week. In addition, most adults need muscle-strengthening exercises on 2 or more days a week.   Maintain a healthy weight. The body mass index (BMI) is a screening tool to identify possible weight problems. It provides an estimate of body fat based on height and weight. Your health care provider can find your BMI and can help you achieve or maintain a healthy weight. For males 20 years and older:  A BMI below 18.5 is considered underweight.  A BMI of 18.5 to 24.9 is normal.  A BMI of 25 to 29.9 is considered overweight.  A BMI of 30 and above is considered obese.  Maintain normal blood lipids and cholesterol by exercising and minimizing your intake of saturated fat. Eat a balanced diet with plenty of fruits and vegetables. Blood tests for lipids and cholesterol should begin at age 47 and be repeated every 5 years. If your lipid or cholesterol levels are high, you are over age 42, or you are at high risk for heart disease, you may need your cholesterol levels  checked more frequently.Ongoing high lipid and cholesterol levels should be treated with medicines if diet and exercise are not working.  If you smoke, find out from your health care provider how to quit. If you do not use tobacco, do not start.  Lung cancer screening is recommended for adults aged 20-80 years who are at high risk for developing lung cancer because of a history of smoking. A yearly low-dose CT scan of the lungs is recommended for people who have at least a 30-pack-year history of smoking and are current smokers or have quit within the past 15 years. A pack year of smoking is smoking an average of 1 pack of cigarettes a day for 1 year (for example, a 30-pack-year history of smoking could mean smoking 1 pack a day for 30 years or 2 packs a day for 15 years). Yearly screening should continue until the smoker has stopped smoking for at least 15 years. Yearly screening should be stopped for people who develop a health problem that would prevent them from having lung cancer treatment.  If you choose to drink alcohol, do not have more than 2 drinks per day. One drink is considered to be 12 oz (360 mL) of beer, 5 oz (150 mL) of wine, or 1.5 oz (45 mL) of liquor.  Avoid the use of street drugs. Do not share needles with anyone. Ask for help if you need support or instructions about stopping the use of drugs.  High blood pressure causes heart disease and  increases the risk of stroke. High blood pressure is more likely to develop in:  People who have blood pressure in the end of the normal range (100-139/85-89 mm Hg).  People who are overweight or obese.  People who are African American.  If you are 56-55 years of age, have your blood pressure checked every 3-5 years. If you are 27 years of age or older, have your blood pressure checked every year. You should have your blood pressure measured twice--once when you are at a hospital or clinic, and once when you are not at a hospital or clinic.  Record the average of the two measurements. To check your blood pressure when you are not at a hospital or clinic, you can use:  An automated blood pressure machine at a pharmacy.  A home blood pressure monitor.  If you are 51-21 years old, ask your health care provider if you should take aspirin to prevent heart disease.  Diabetes screening involves taking a blood sample to check your fasting blood sugar level. This should be done once every 3 years after age 87 if you are at a normal weight and without risk factors for diabetes. Testing should be considered at a younger age or be carried out more frequently if you are overweight and have at least 1 risk factor for diabetes.  Colorectal cancer can be detected and often prevented. Most routine colorectal cancer screening begins at the age of 39 and continues through age 86. However, your health care provider may recommend screening at an earlier age if you have risk factors for colon cancer. On a yearly basis, your health care provider may provide home test kits to check for hidden blood in the stool. A small camera at the end of a tube may be used to directly examine the colon (sigmoidoscopy or colonoscopy) to detect the earliest forms of colorectal cancer. Talk to your health care provider about this at age 45 when routine screening begins. A direct exam of the colon should be repeated every 5-10 years through age 43, unless early forms of precancerous polyps or small growths are found.  People who are at an increased risk for hepatitis B should be screened for this virus. You are considered at high risk for hepatitis B if:  You were born in a country where hepatitis B occurs often. Talk with your health care provider about which countries are considered high risk.  Your parents were born in a high-risk country and you have not received a shot to protect against hepatitis B (hepatitis B vaccine).  You have HIV or AIDS.  You use needles to  inject street drugs.  You live with, or have sex with, someone who has hepatitis B.  You are a man who has sex with other men (MSM).  You get hemodialysis treatment.  You take certain medicines for conditions like cancer, organ transplantation, and autoimmune conditions.  Hepatitis C blood testing is recommended for all people born from [birth year] through 1966 and any individual with known risk factors for hepatitis C.  Healthy men should no longer receive prostate-specific antigen (PSA) blood tests as part of routine cancer screening. Talk to your health care provider about prostate cancer screening.  Testicular cancer screening is not recommended for adolescents or adult males who have no symptoms. Screening includes self-exam, a health care provider exam, and other screening tests. Consult with your health care provider about any symptoms you have or any concerns you have about testicular cancer.  Practice safe sex. Use condoms and avoid high-risk sexual practices to reduce the spread of sexually transmitted infections (STIs).  You should be screened for STIs, including gonorrhea and chlamydia if:  You are sexually active and are younger than 24 years.  You are older than 24 years, and your health care provider tells you that you are at risk for this type of infection.  Your sexual activity has changed since you were last screened, and you are at an increased risk for chlamydia or gonorrhea. Ask your health care provider if you are at risk.  If you are at risk of being infected with HIV, it is recommended that you take a prescription medicine daily to prevent HIV infection. This is called pre-exposure prophylaxis (PrEP). You are considered at risk if:  You are a man who has sex with other men (MSM).  You are a heterosexual man who is sexually active with multiple partners.  You take drugs by injection.  You are sexually active with a partner who has HIV.  Talk with your health care  provider about whether you are at high risk of being infected with HIV. If you choose to begin PrEP, you should first be tested for HIV. You should then be tested every 3 months for as long as you are taking PrEP.  Use sunscreen. Apply sunscreen liberally and repeatedly throughout the day. You should seek shade when your shadow is shorter than you. Protect yourself by wearing long sleeves, pants, a wide-brimmed hat, and sunglasses year round whenever you are outdoors.  Tell your health care provider of new moles or changes in moles, especially if there is a change in shape or color. Also, tell your health care provider if a mole is larger than the size of a pencil eraser.  A one-time screening for abdominal aortic aneurysm (AAA) and surgical repair of large AAAs by ultrasound is recommended for men aged 82-75 years who are current or former smokers.  Stay current with your vaccines (immunizations).   This information is not intended to replace advice given to you by your health care provider. Make sure you discuss any questions you have with your health care provider.   Document Released: 11/12/2007 Document Revised: 06/06/2014 Document Reviewed: 10/11/2010 Elsevier Interactive Patient Education [employer].

## 2015-04-29 NOTE — Telephone Encounter (Signed)
Lmov to schedule referral

## 2015-04-29 NOTE — Progress Notes (Signed)
Patient ID: Todd Jenkins, male    DOB: Jul 02, 1960  Age: 54 y.o. MRN: [id number]  The patient is here for annual physical  examination and follow up on CAD, Type 2 DM, hypertension and hyperlipidemia.    colonoscopy WAS DONE IN 2012.  Follow up  due 2017   Needs to see Dr. Rockey Situ for follow up  In January     The risk factors are reflected in the social history.  The roster of all physicians providing medical care to patient - is listed in the Snapshot section of the chart.  Home safety : The patient has smoke detectors in the home. They wear seatbelts.  There are no firearms at home. There is no violence in the home.   There is no risks for hepatitis, STDs or HIV. There is no   history of blood transfusion. They have no travel history to infectious disease endemic areas of the world.  The patient has seen their dentist in the last six month. They have seen their eye doctor in the last year. They have no hearing difficulty. They do not  have excessive sun exposure. Discussed the need for sun protection: hats, long sleeves and use of sunscreen if there is significant sun exposure.   Diet: the importance of a healthy diet is discussed. They do have a healthy diet.  The benefits of regular aerobic exercise were discussed. He walks 4 times per week ,  20 minutes.   Depression screen: there are no signs or vegative symptoms of depression- irritability, change in appetite, anhedonia, sadness/tearfullness.   The following portions of the patient's history were reviewed and updated as appropriate: allergies, current medications, past family history, past medical history,  past surgical history, past social history  and problem list.  Visual acuity was not assessed per patient preference since she has regular follow up with her ophthalmologist. Hearing and body mass index were assessed and reviewed.   During the course of the visit the patient was educated and counseled about appropriate  screening and preventive services including : fall prevention , diabetes screening, nutrition counseling, colorectal cancer screening, and recommended immunizations.    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 Diagnoses of Coronary artery disease due to lipid rich plaque, Benign skin lesion of nose, Hyperlipidemia with target LDL less than 70, Prostate cancer screening, Screen for STD (sexually transmitted disease), Family history of colon cancer requiring screening colonoscopy, Special screening for malignant neoplasm of prostate, Basal cell carcinoma of nasal sidewall, Bursitis of left shoulder, and Encounter for preventive health examination were also pertinent to this visit.    He feels generally well, is exercising several times per week and checking blood sugars once daily at variable times.  BS have been under 130 fasting and < 150 post prandially.  Denies any recent hypoglyemic events.  Taking his medications as directed. Following a carbohydrate modified diet 6 days per week. Denies numbness, burning and tingling of extremities. Appetite is good. Eye exam is scheduled for jan 2017.  He continues to have occasional pain in his left shoulder. He is taking 1000 IUs of Vit D3.  He is no longer taking turmeric . Previous sports medicine evaluation yielded a diagnosis of subacromial bursitis and mild degenerative changes of the rotator cuff .  He does not want additional imaging at evaluation at this time   History Todd Jenkins has a past medical history of Coronary artery disease; H/O non-ST elevation myocardial  infarction (NSTEMI) (July 2012); Diabetes mellitus; Hyperlipidemia; and Hypertension.   He has past surgical history that includes Cardiac catheterization (July 2012) and Gastrostomy w/ feeding tube.   His family history includes Cancer in his father and mother; Diabetes in his mother; Heart disease in his mother; Hypertension in  his father.He reports that he quit smoking about 4 years ago. His smoking use included Cigars. He has never used smokeless tobacco. He reports that he does not drink alcohol or use illicit drugs.  Outpatient Prescriptions Prior to Visit  Medication Sig Dispense Refill  . aspirin 81 MG tablet Take 81 mg by mouth daily.    Marland Kitchen azelastine (OPTIVAR) 0.05 % ophthalmic solution Place 1 drop into both eyes 2 (two) times daily. 6 mL 12  . clopidogrel (PLAVIX) 75 MG tablet TAKE 1 TABLET BY MOUTH EVERY DAY 90 tablet 1  . glipiZIDE (GLUCOTROL) 10 MG tablet Take 1 tablet (10 mg total) by mouth 2 (two) times daily before a meal. 180 tablet 1  . metFORMIN (GLUCOPHAGE) 850 MG tablet TAKE 1 TABLET (850 MG TOTAL) BY MOUTH 2 (TWO) TIMES DAILY WITH A MEAL. 180 tablet 1  . metoprolol succinate (TOPROL-XL) 25 MG 24 hr tablet TAKE 1/2 TABLET BY MOUTH DAILY. 90 tablet 1  . nystatin-triamcinolone ointment (MYCOLOG) Apply 1 application topically 2 (two) times daily. 30 g 0  . ONE TOUCH ULTRA TEST test strip USE AS INSTRUCTED 100 each 5  . sildenafil (VIAGRA) 100 MG tablet Take 0.5-1 tablets (50-100 mg total) by mouth daily as needed for erectile dysfunction. 5 tablet 11  . atorvastatin (LIPITOR) 40 MG tablet TAKE 1 TABLET BY MOUTH DAILY. 90 tablet 1  . enalapril (VASOTEC) 5 MG tablet TAKE 1 TABLET (5 MG TOTAL) BY MOUTH 2 (TWO) TIMES DAILY. 180 tablet 1   No facility-administered medications prior to visit.    Review of Systems   Patient denies headache, fevers, malaise, unintentional weight loss, skin rash, eye pain, sinus congestion and sinus pain, sore throat, dysphagia,  hemoptysis , cough, dyspnea, wheezing, chest pain, palpitations, orthopnea, edema, abdominal pain, nausea, melena, diarrhea, constipation, flank pain, dysuria, hematuria, urinary  Frequency, nocturia, numbness, tingling, seizures,  Focal weakness, Loss of consciousness,  Tremor, insomnia, depression, anxiety, and suicidal ideation.      Objective:   BP 138/84 mmHg  Pulse 50  Temp(Src) 98.5 F (36.9 C) (Oral)  Resp 12  Ht 5' 11.5" (1.816 m)  Wt 256 lb 8 oz (116.348 kg)  BMI 35.28 kg/m2  SpO2 97%  Physical Exam   General appearance: alert, cooperative and appears stated age Ears: normal TM's and external ear canals both ears Throat: lips, mucosa, and tongue normal; teeth and gums normal Neck: no adenopathy, no carotid bruit, supple, symmetrical, trachea midline and thyroid not enlarged, symmetric, no tenderness/mass/nodules Back: symmetric, no curvature. ROM normal. No CVA tenderness. Lungs: clear to auscultation bilaterally Heart: regular rate and rhythm, S1, S2 normal, no murmur, click, rub or gallop Abdomen: soft, non-tender; bowel sounds normal; no masses,  no organomegaly Pulses: 2+ and symmetric Skin: Skin color, texture, turgor normal. Suspicious lesion right side of nose c/w BCC noted. 1+ pitting edema noted bilaterally  Lymph nodes: Cervical, supraclavicular, and axillary nodes normal. Neuro: CNs 2-12 intact. DTRs 2+/4 in biceps, brachioradialis, patellars and achilles. Muscle strength 5/5 in upper and lower exremities.  cerebellar function normal. Romberg negative.  No pronator drift.   Gait normal.  Foot exam:  Nails are well trimmed,  No callouses,  Sensation  intact to microfilament   Assessment & Plan:   Problem List Items Addressed This Visit    Coronary artery disease    He has been asymptomatic, is taking his medications including daily aspirin, ACE inhibitor, beta blocker and statin.  Continue semi annual follow up with Dr. Rockey Situ in January .  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  hemoglobin A1c is at goal of less than 7.0 . Patient is reminded to schedule an annual eye exam and foot exam is normal today. Patient has minimal  microalbuminuria. Patient is tolerating statin therapy for CAD risk reduction and on ACE/ARB for renal protection and hypertension .  Lab Results  Component Value Date   HGBA1C 6.4 04/29/2015   Lab Results  Component Value Date   MICROALBUR 1.7 10/16/2014         Relevant Medications   enalapril-hydrochlorothiazide (VASERETIC) 10-25 MG tablet   Other Relevant Orders   Hemoglobin A1c (Completed)   LDL cholesterol, direct (Completed)   Lipid panel (Completed)   Comprehensive metabolic panel (Completed)   Hyperlipidemia with target LDL less than 70    Well controlled on current statin therapy.   Liver enzymes are normal , no changes today.  Lab Results  Component Value Date   CHOL 144 04/29/2015   HDL 44.80 04/29/2015   LDLCALC 69 04/29/2015   LDLDIRECT 61.0 04/29/2015   TRIG 154.0* 04/29/2015   CHOLHDL 3 04/29/2015   Lab Results  Component Value Date   ALT 29 04/29/2015   AST 15 04/29/2015   ALKPHOS 71 04/29/2015   BILITOT 0.9 04/29/2015               Relevant Medications   enalapril-hydrochlorothiazide (VASERETIC) 10-25 MG tablet   Other Relevant Orders   TSH (Completed)   RESOLVED: Special screening for malignant neoplasm of prostate                         Encounter for preventive health examination    Annual wellness  exam was done as well as a comprehensive physical exam and management of acute and chronic conditions .  During the course of the visit the patient was educated and counseled about appropriate screening and preventive services including :  diabetes screening, lipid analysis with projected  10 year  risk for CAD , nutrition counseling, colorectal cancer screening, and recommended  immunizations.  Printed recommendations for health maintenance screenings was given.   Lab Results  Component Value Date   PSA 0.40 04/29/2015   PSA 0.43 04/16/2014   PSA 0.48 04/11/2013         Bursitis of left shoulder    Subacromial,  Intermittently problematic.  complicated by mild degenerative changes of RCC .  Prior steroid injection by Dr Tamala Julian .  NSAIDS and icing recommended.       Basal cell carcinoma of nasal sidewall    BCC suspected,  Referral to Dr Evorn Gong in process.  Other Visit Diagnoses    Prostate cancer screening        Relevant Orders    PSA (Completed)    Screen for STD (sexually transmitted disease)        Relevant Orders    Hepatitis C antibody (Completed)    HIV antibody (Completed)    Family history of colon cancer requiring screening colonoscopy        Relevant Orders    Ambulatory referral to General Surgery       I have discontinued Mr. Bagnell's enalapril. I am also having him start on enalapril-hydrochlorothiazide. Additionally, I am having him maintain his aspirin, clopidogrel, metoprolol succinate, sildenafil, nystatin-triamcinolone ointment, azelastine, metFORMIN, glipiZIDE, and ONE TOUCH ULTRA TEST.  Meds ordered this encounter  Medications  . enalapril-hydrochlorothiazide (VASERETIC) 10-25 MG tablet    Sig: Take 1 tablet by mouth daily.    Dispense:  90 tablet    Refill:  1    Medications Discontinued During This Encounter  Medication Reason  . enalapril (VASOTEC) 5 MG tablet Reorder    Follow-up: Return in about 4 months (around 08/27/2015) for follow up diabetes.   Crecencio Mc, MD

## 2015-04-29 NOTE — Progress Notes (Signed)
Pre-visit discussion using our clinic review tool. No additional management support is needed unless otherwise documented below in the visit note.  

## 2015-04-30 ENCOUNTER — Other Ambulatory Visit: Payer: Self-pay | Admitting: Internal Medicine

## 2015-04-30 LAB — HIV ANTIBODY (ROUTINE TESTING W REFLEX): HIV 1&2 Ab, 4th Generation: NONREACTIVE

## 2015-04-30 LAB — HEPATITIS C ANTIBODY: HCV AB: NEGATIVE

## 2015-05-01 ENCOUNTER — Encounter: Payer: Self-pay | Admitting: Internal Medicine

## 2015-05-01 NOTE — Assessment & Plan Note (Signed)
Annual wellness  exam was done as well as a comprehensive physical exam and management of acute and chronic conditions .  During the course of the visit the patient was educated and counseled about appropriate screening and preventive services including :  diabetes screening, lipid analysis with projected  10 year  risk for CAD , nutrition counseling, colorectal cancer screening, and recommended immunizations.  Printed recommendations for health maintenance screenings was given.   Lab Results  Component Value Date   PSA 0.40 04/29/2015   PSA 0.43 04/16/2014   PSA 0.48 04/11/2013

## 2015-05-01 NOTE — Assessment & Plan Note (Signed)
BCC suspected,  Referral to Dr Evorn Gong in process.

## 2015-05-01 NOTE — Assessment & Plan Note (Signed)
Subacromial,  Intermittently problematic.  complicated by mild degenerative changes of RCC .  Prior steroid injection by Dr Tamala Julian .  NSAIDS and icing recommended.

## 2015-05-01 NOTE — Assessment & Plan Note (Signed)
Well controlled on current statin therapy.   Liver enzymes are normal , no changes today.  Lab Results  Component Value Date   CHOL 144 04/29/2015   HDL 44.80 04/29/2015   LDLCALC 69 04/29/2015   LDLDIRECT 61.0 04/29/2015   TRIG 154.0* 04/29/2015   CHOLHDL 3 04/29/2015   Lab Results  Component Value Date   ALT 29 04/29/2015   AST 15 04/29/2015   ALKPHOS 71 04/29/2015   BILITOT 0.9 04/29/2015

## 2015-05-01 NOTE — Assessment & Plan Note (Signed)
Currently well-controlled on current medications .  hemoglobin A1c is at goal of less than 7.0 . Patient is reminded to schedule an annual eye exam and foot exam is normal today. Patient has minimal  microalbuminuria. Patient is tolerating statin therapy for CAD risk reduction and on ACE/ARB for renal protection and hypertension .  Lab Results  Component Value Date   HGBA1C 6.4 04/29/2015   Lab Results  Component Value Date   MICROALBUR 1.7 10/16/2014

## 2015-05-01 NOTE — Assessment & Plan Note (Addendum)
He has been asymptomatic, is taking his medications including daily aspirin, ACE inhibitor, beta blocker and statin.  Continue semi annual follow up with Dr. Rockey Situ in January .  Lab Results  Component Value Date   CHOL 144 04/29/2015   HDL 44.80 04/29/2015   LDLCALC 69 04/29/2015   LDLDIRECT 61.0 04/29/2015   TRIG 154.0* 04/29/2015   CHOLHDL 3 04/29/2015

## 2015-05-11 ENCOUNTER — Encounter: Payer: Self-pay | Admitting: *Deleted

## 2015-05-18 ENCOUNTER — Encounter: Payer: Self-pay | Admitting: *Deleted

## 2015-05-27 ENCOUNTER — Encounter: Payer: Self-pay | Admitting: Internal Medicine

## 2015-05-28 MED ORDER — GLUCOSE BLOOD VI STRP: ORAL_STRIP | Status: DC

## 2015-05-29 ENCOUNTER — Other Ambulatory Visit: Payer: Self-pay | Admitting: Internal Medicine

## 2015-06-30 ENCOUNTER — Ambulatory Visit (INDEPENDENT_AMBULATORY_CARE_PROVIDER_SITE_OTHER): Payer: BLUE CROSS/BLUE SHIELD | Admitting: Nurse Practitioner

## 2015-06-30 ENCOUNTER — Encounter: Payer: Self-pay | Admitting: Nurse Practitioner

## 2015-06-30 VITALS — BP 126/82 | HR 54 | Ht 72.0 in | Wt 253.4 lb

## 2015-06-30 DIAGNOSIS — E785 Hyperlipidemia, unspecified: Secondary | ICD-10-CM

## 2015-06-30 DIAGNOSIS — N529 Male erectile dysfunction, unspecified: Secondary | ICD-10-CM | POA: Insufficient documentation

## 2015-06-30 DIAGNOSIS — I119 Hypertensive heart disease without heart failure: Secondary | ICD-10-CM | POA: Diagnosis not present

## 2015-06-30 DIAGNOSIS — E119 Type 2 diabetes mellitus without complications: Secondary | ICD-10-CM

## 2015-06-30 DIAGNOSIS — I2510 Atherosclerotic heart disease of native coronary artery without angina pectoris: Secondary | ICD-10-CM | POA: Diagnosis not present

## 2015-06-30 DIAGNOSIS — I255 Ischemic cardiomyopathy: Secondary | ICD-10-CM | POA: Insufficient documentation

## 2015-06-30 NOTE — Progress Notes (Signed)
Cardiology Clinic Note   Patient Name: Todd Jenkins Date of Encounter: 06/30/2015  Primary Care Provider:  Crecencio Mc, MD Primary Cardiologist:  Johnny Bridge, MD (last seen 2013).  Patient Profile    55 year old male with a history of CAD status post non-ST segment elevation myocardial infarction and LAD stenting in July 2012, who presents to reestablish cardiology care.  Past Medical History    Past Medical History  Diagnosis Date  . Coronary artery disease     a. 11/2010 NSTEMI s/p PCI/DES to 99 % LAD (3.0x18 Xience).  . Type II diabetes mellitus ([place])   . Hyperlipidemia   . Hypertensive heart disease   . Morbid obesity ([place])   . Erectile dysfunction   . Ischemic cardiomyopathy     a. 11/2010 LV gram: EF 30-35%; b. 11/2010 Echo: EF 40-45%, impaired LV relaxation, mod-sev Apical AK, nl RV fxn.   Past Surgical History  Procedure Laterality Date  . Cardiac catheterization  July 2012  . 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 He has a history of coronary artery disease dating back to July 2012, at which time he presented with chest pain and was found to have an occluded LAD. This was successfully treated with drug-eluting stent placement. EF was initially measured at 30-35% but follow up echo revealed an EF of 40-45%. He was last seen in clinic here in late 2013. Since then, he has done quite well from a cardiac standpoint. He is active but does not generally exercise. In that setting, he gained some weight between 2013 and 2015 but has been trying to lose weight and is interested in getting back into exercise. He has not had any chest pain, dyspnea, palpitations, PND, orthopnea, dizziness, syncope, or edema over the past 3-1/2 years. He is tolerating his medications well. He smokes 1 cigar a week and also has 2-3 drinks  per night.   Home Medications    Prior to Admission medications   Medication Sig Start Date End Date Taking? Authorizing Provider  aspirin 81 MG tablet Take 81 mg by mouth daily.   Yes Historical Provider, MD  atorvastatin (LIPITOR) 40 MG tablet TAKE 1 TABLET BY MOUTH DAILY. 04/30/15  Yes Crecencio Mc, MD  azelastine (OPTIVAR) 0.05 % ophthalmic solution Place 1 drop into both eyes 2 (two) times daily. 10/16/14  Yes Crecencio Mc, MD  clopidogrel (PLAVIX) 75 MG tablet TAKE 1 TABLET BY MOUTH EVERY DAY 04/30/15  Yes Crecencio Mc, MD  enalapril-hydrochlorothiazide (VASERETIC) 10-25 MG tablet Take 1 tablet by mouth daily. 04/29/15  Yes Crecencio Mc, MD  glipiZIDE (GLUCOTROL) 10 MG tablet Take 1 tablet (10 mg total) by mouth 2 (two) times daily before a meal. 02/26/15  Yes Crecencio Mc, MD  glucose blood (BAYER CONTOUR NEXT TEST) test strip Use as instructed 05/28/15  Yes Crecencio Mc, MD  metFORMIN (GLUCOPHAGE) 850 MG tablet TAKE 1 TABLET (850 MG TOTAL) BY MOUTH 2 (TWO) TIMES DAILY WITH A MEAL. 05/29/15  Yes Crecencio Mc, MD  metoprolol succinate (TOPROL-XL) 25 MG 24 hr tablet TAKE 1/2 TABLET BY MOUTH DAILY. 05/29/15  Yes Crecencio Mc, MD  nystatin-triamcinolone ointment (MYCOLOG) Apply 1 application topically 2 (two) times daily. 07/17/14  Yes Crecencio Mc, MD  sildenafil (VIAGRA) 100 MG tablet Take 0.5-1 tablets (50-100 mg total) by mouth daily as needed for erectile dysfunction. 07/17/14  Yes Crecencio Mc, MD    Family History    Family History  Problem Relation Age of Onset  . Cancer Mother     liver  . Diabetes Mother   . Heart disease Mother   . Cancer Father     colon  . Hypertension Father     Social History    Social History   Social History  . Marital Status: Single    Spouse Name: N/A  . Number of Children: N/A  . Years of Education: N/A   Occupational History  . Not on file.   Social History Main Topics  . Smoking status: Current Some Day Smoker     Types: Cigars  . Smokeless tobacco: Never Used     Comment: smokes 1 cigar weekly.  . Alcohol Use: 0.0 oz/week    0 Standard drinks or equivalent per week     Comment: 2 - 3 drinks/day.  . Drug Use: No  . Sexual Activity: Not on file   Other Topics Concern  . Not on file   Social History Narrative   Lives locally with wife.  Plays golf (rides course) but otw does not routinely exercise.     Review of Systems    General:  No chills, fever, night sweats or weight changes.  Cardiovascular:  No chest pain, dyspnea on exertion, edema, orthopnea, palpitations, paroxysmal nocturnal dyspnea. Dermatological: No rash, lesions/masses Respiratory: No cough, dyspnea Urologic: No hematuria, dysuria Abdominal:   No nausea, vomiting, diarrhea, bright red blood per rectum, melena, or hematemesis Neurologic:  No visual changes, wkns, changes in mental status. All other systems reviewed and are otherwise negative except as noted above.  Physical Exam    VS:  BP 126/82 mmHg  Pulse 54  Ht 6' (1.829 m)  Wt 253 lb 6.4 oz (114.941 kg)  BMI 34.36 kg/m2 , BMI Body mass index is 34.36 kg/(m^2). GEN: Well nourished, well developed, in no acute distress. HEENT: normal. Neck: Supple, no JVD, carotid bruits, or masses. Cardiac: RRR, no murmurs, rubs, or gallops. No clubbing, cyanosis, edema.  Radials/DP/PT 2+ and equal bilaterally.  Respiratory:  Respirations regular and unlabored, clear to auscultation bilaterally. GI: Soft, nontender, nondistended, BS + x 4. MS: no deformity or atrophy. Skin: warm and dry, no rash. Neuro:  Strength and sensation are intact. Psych: Normal affect.  Accessory Clinical Findings    ECG - says bradycardia, 54, prior septal infarct, no acute ST or T changes.  Assessment & Plan   1.  Coronary artery disease: Status post prior non-STEMI in July 2012 with drug-eluting stent placement to the LAD at that time. He has done well from a cardiac standpoint ever since then and  is active without chest pain or dyspnea. He would like to get back into an exercise regimen and as he is asymptomatic, I have encouraged him to do so. He remains on aspirin, statin, Plavix, ACE inhibitor, and beta blocker therapy. We did discuss the unclear role of Plavix now 4.5 years following an MI. He says however that he is tolerating it and has no problems affording it and prefers to stay on it.  2. Hypertensive heart disease: This is well controlled on beta blocker, ACE inhibitor, and HCTZ therapy. HCTZ was just added a few months ago and I will arrange for a follow-up basic metabolic panel today.  3.  Hyperlipidemia: He had lipids and LFTs in November 2016. LFTs were normal. Total cholesterol was 144, tragus was 154, HDL 44, LDL 69. Continue moderate dose statin therapy.  4. Morbid obesity: Patient has been encouraged to increase his activity and also reduce caloric intake.  5. EtOH usage: Patient is drinking 2-3 drinks daily. I recommend that he cut back to one or fewer.  6. Tobacco abuse: Patient smokes 1 cigar per week. I recommended that he discontinue usage.  7. Ischemic cardiomyopathy: Last document EF was 40-45%. He does well without any dyspnea on exertion and also denies PND, orthopnea, or edema. He remains on beta blocker and ACE inhibitor therapy.  8. Type 2 diabetes mellitus: This is followed closely by primary care. Hemoglobin A1c in November was 6.4.   9. Disposition: Follow-up with Dr. Burna Cash in one year or sooner if necessary.  Murray Hodgkins, NP 06/30/2015, 1:13 PM

## 2015-06-30 NOTE — Patient Instructions (Signed)
Medication Instructions:  Your physician recommends that you continue on your current medications as directed. Please refer to the Current Medication list given to you today.   Labwork: BMET  Testing/Procedures: none  Follow-Up: Your physician wants you to follow-up in: one year with Dr. Rockey Situ.  You will receive a reminder letter in the mail two months in advance. If you don't receive a letter, please call our office to schedule the follow-up appointment.   Any Other Special Instructions Will Be Listed Below (If Applicable).     If you need a refill on your cardiac medications before your next appointment, please call your pharmacy.

## 2015-07-01 ENCOUNTER — Encounter: Payer: Self-pay | Admitting: *Deleted

## 2015-07-01 LAB — BASIC METABOLIC PANEL WITH GFR
BUN/Creatinine Ratio: 18 (ref 9–20)
BUN: 18 mg/dL (ref 6–24)
CO2: 22 mmol/L (ref 18–29)
Calcium: 8.9 mg/dL (ref 8.7–10.2)
Chloride: 100 mmol/L (ref 96–106)
Creatinine, Ser: 1 mg/dL (ref 0.76–1.27)
GFR calc Af Amer: 98 mL/min/1.73
GFR calc non Af Amer: 84 mL/min/1.73
Glucose: 153 mg/dL — ABNORMAL HIGH (ref 65–99)
Potassium: 4.6 mmol/L (ref 3.5–5.2)
Sodium: 141 mmol/L (ref 134–144)

## 2015-07-01 LAB — HM DIABETES EYE EXAM

## 2015-07-21 ENCOUNTER — Encounter: Payer: Self-pay | Admitting: Internal Medicine

## 2015-08-25 ENCOUNTER — Other Ambulatory Visit: Payer: Self-pay | Admitting: Internal Medicine

## 2015-10-29 ENCOUNTER — Other Ambulatory Visit: Payer: Self-pay | Admitting: Internal Medicine

## 2015-11-02 ENCOUNTER — Encounter: Payer: Self-pay | Admitting: Internal Medicine

## 2015-11-02 ENCOUNTER — Ambulatory Visit (INDEPENDENT_AMBULATORY_CARE_PROVIDER_SITE_OTHER): Payer: BLUE CROSS/BLUE SHIELD | Admitting: Internal Medicine

## 2015-11-02 ENCOUNTER — Telehealth: Payer: Self-pay

## 2015-11-02 VITALS — BP 108/66 | HR 60 | Temp 98.7°F | Resp 12 | Ht 71.0 in | Wt 244.8 lb

## 2015-11-02 DIAGNOSIS — R809 Proteinuria, unspecified: Secondary | ICD-10-CM

## 2015-11-02 DIAGNOSIS — I119 Hypertensive heart disease without heart failure: Secondary | ICD-10-CM | POA: Diagnosis not present

## 2015-11-02 DIAGNOSIS — E119 Type 2 diabetes mellitus without complications: Secondary | ICD-10-CM | POA: Diagnosis not present

## 2015-11-02 DIAGNOSIS — I255 Ischemic cardiomyopathy: Secondary | ICD-10-CM | POA: Diagnosis not present

## 2015-11-02 DIAGNOSIS — E785 Hyperlipidemia, unspecified: Secondary | ICD-10-CM

## 2015-11-02 DIAGNOSIS — E1129 Type 2 diabetes mellitus with other diabetic kidney complication: Secondary | ICD-10-CM

## 2015-11-02 NOTE — Progress Notes (Signed)
Subjective:  Patient ID: Todd Jenkins, male    DOB: 1960/07/27  Age: 55 y.o. MRN: [id number]  CC: The primary encounter diagnosis was Hyperlipidemia with target LDL less than 70. 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  HPI Todd Jenkins presents for 3 month follow up on diabetes.  Patient has no complaints today.  Patient is following a low glycemic index diet and taking all prescribed medications regularly without side effects.  Fasting sugars have been under less than 160 most of the time and post prandials have been under 160 except on rare occasions. Patient is exercising about 3 times per week and intentionally trying to lose weight .  Patient has had an eye exam in the last 12 months and checks feet regularly for signs of infection.  Patient does not walk barefoot outside,  And denies an numbness tingling or burning in feet. Patient is up to date on all recommended vaccinations  Travelled extensively during April/May . Sugars were elevated the most during that time.  Missed the [facility] & P for April 13th  for the colonoscopy.  Had eye exam since last visit Cardiology did an EKG  Dropped 12 lbs .  Is drinking less alcohol. Exercising 3-4 times per week   Lab Results  Component Value Date   HGBA1C 6.3 11/02/2015   Lab Results  Component Value Date   CHOL 144 04/29/2015   HDL 44.80 04/29/2015   LDLCALC 69 04/29/2015   LDLDIRECT 59.0 11/02/2015   TRIG 154.0* 04/29/2015   CHOLHDL 3 04/29/2015   Lab Results  Component Value Date   ALT 25 11/02/2015   AST 15 11/02/2015   ALKPHOS 72 11/02/2015   BILITOT 0.9 11/02/2015     Outpatient Prescriptions Prior to Visit  Medication Sig Dispense Refill  . aspirin 81 MG tablet Take 81 mg by mouth daily.    Marland Kitchen atorvastatin (LIPITOR) 40 MG tablet TAKE 1 TABLET BY MOUTH DAILY. 90  tablet 1  . azelastine (OPTIVAR) 0.05 % ophthalmic solution Place 1 drop into both eyes 2 (two) times daily. 6 mL 12  . clopidogrel (PLAVIX) 75 MG tablet TAKE 1 TABLET BY MOUTH EVERY DAY 90 tablet 1  . enalapril-hydrochlorothiazide (VASERETIC) 10-25 MG tablet TAKE 1 TABLET BY MOUTH DAILY. 90 tablet 1  . glipiZIDE (GLUCOTROL) 10 MG tablet TAKE 1 TABLET (10 MG TOTAL) BY MOUTH 2 (TWO) TIMES DAILY BEFORE A MEAL. 180 tablet 1  . glucose blood (BAYER CONTOUR NEXT TEST) test strip Use as instructed 100 each 5  . metFORMIN (GLUCOPHAGE) 850 MG tablet TAKE 1 TABLET (850 MG TOTAL) BY MOUTH 2 (TWO) TIMES DAILY WITH A MEAL. 180 tablet 1  . metoprolol succinate (TOPROL-XL) 25 MG 24 hr tablet TAKE 1/2 TABLET BY MOUTH DAILY. 90 tablet 1  . nystatin-triamcinolone ointment (MYCOLOG) Apply 1 application topically 2 (two) times daily. 30 g 0  . sildenafil (VIAGRA) 100 MG tablet Take 0.5-1 tablets (50-100 mg total) by mouth daily as needed for erectile dysfunction. 5 tablet 11   No facility-administered medications prior to visit.    Review of Systems;  Patient denies headache, fevers, malaise, unintentional weight loss, skin rash, eye pain, sinus congestion and sinus pain, sore throat, dysphagia,  hemoptysis , cough, dyspnea, wheezing, chest pain, palpitations, orthopnea, edema, abdominal pain, nausea, melena, diarrhea, constipation, flank pain, dysuria, hematuria, urinary  Frequency, nocturia, numbness, tingling, seizures,  Focal weakness, Loss of consciousness,  Tremor, insomnia, depression, anxiety, and suicidal ideation.     Objective:  BP 108/66 mmHg  Pulse 60  Temp(Src) 98.7 F (37.1 C) (Oral)  Resp 12  Ht 5\' 11"  (1.803 m)  Wt 244 lb 12 oz (111.018 kg)  BMI 34.15 kg/m2  SpO2 96%  BP Readings from Last 3 Encounters:  11/02/15 108/66  06/30/15 126/82  04/29/15 138/84    Wt Readings from Last 3 Encounters:  11/02/15 244 lb 12 oz (111.018 kg)  06/30/15 253 lb 6.4 oz (114.941 kg)  04/29/15 256 lb  8 oz (116.348 kg)    General appearance: alert, cooperative and appears stated age Ears: normal TM's and external ear canals both ears Throat: lips, mucosa, and tongue normal; teeth and gums normal Neck: no adenopathy, no carotid bruit, supple, symmetrical, trachea midline and thyroid not enlarged, symmetric, no tenderness/mass/nodules Back: symmetric, no curvature. ROM normal. No CVA tenderness. Lungs: clear to auscultation bilaterally Heart: regular rate and rhythm, S1, S2 normal, no murmur, click, rub or gallop Abdomen: soft, non-tender; bowel sounds normal; no masses,  no organomegaly Pulses: 2+ and symmetric Skin: Skin color, texture, turgor normal. No rashes or lesions Lymph nodes: Cervical, supraclavicular, and axillary nodes normal.  Lab Results  Component Value Date   HGBA1C 6.3 11/02/2015   HGBA1C 6.4 04/29/2015   HGBA1C 6.5 01/21/2015    Lab Results  Component Value Date   CREATININE 1.02 11/02/2015   CREATININE 1.00 06/30/2015   CREATININE 0.93 04/29/2015    Lab Results  Component Value Date   WBC 6.4 04/11/2013   HGB 14.6 04/11/2013   HCT 42.5 04/11/2013   PLT 212.0 04/11/2013   GLUCOSE 156* 11/02/2015   CHOL 144 04/29/2015   TRIG 154.0* 04/29/2015   HDL 44.80 04/29/2015   LDLDIRECT 59.0 11/02/2015   LDLCALC 69 04/29/2015   ALT 25 11/02/2015   AST 15 11/02/2015   NA 139 11/02/2015   K 4.0 11/02/2015   CL 101 11/02/2015   CREATININE 1.02 11/02/2015   BUN 26* 11/02/2015   CO2 29 11/02/2015   TSH 3.83 04/29/2015   PSA 0.40 04/29/2015   HGBA1C 6.3 11/02/2015   MICROALBUR 1.7 10/16/2014    No results found.  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  hemoglobin A1c is at goal of less than 7.0 . Patient is reminded to schedule an annual eye exam and foot exam is normal today. Patient has minimal   microalbuminuria. Patient is tolerating statin therapy for CAD risk reduction and on ACE/ARB for renal protection and hypertension .  Lab Results  Component Value Date   HGBA1C 6.3 11/02/2015   Lab Results  Component Value Date   MICROALBUR 1.7 10/16/2014           Relevant Orders   Comprehensive metabolic panel (Completed)   Hemoglobin A1c (Completed)   Microalbumin / creatinine urine ratio   Hyperlipidemia with target LDL less than 70 - Primary    Well controlled on current statin therapy.   Liver enzymes are normal , no changes today.  Lab Results  Component Value Date   CHOL 144 04/29/2015   HDL 44.80 04/29/2015   LDLCALC 69 04/29/2015   LDLDIRECT 59.0 11/02/2015   TRIG 154.0* 04/29/2015   CHOLHDL 3 04/29/2015   Lab Results  Component Value Date   ALT  25 11/02/2015   AST 15 11/02/2015   ALKPHOS 72 11/02/2015   BILITOT 0.9 11/02/2015                 Relevant Orders   LDL cholesterol, direct (Completed)   Hypertensive heart disease    Well controlled on current regimen. Renal function stable, no changes today.   Lab Results  Component Value Date   CREATININE 1.02 11/02/2015   Lab Results  Component Value Date   NA 139 11/02/2015   K 4.0 11/02/2015   CL 101 11/02/2015   CO2 29 11/02/2015         Ischemic cardiomyopathy    A total of 25 minutes of face to face time was spent with patient more than half of which was spent in counselling about the above mentioned conditions  and coordination of care   I am having Mr. Lortie maintain his aspirin, sildenafil, nystatin-triamcinolone ointment, azelastine, glucose blood, metFORMIN, metoprolol succinate, glipiZIDE, atorvastatin, clopidogrel, and enalapril-hydrochlorothiazide.  No orders of the defined types were placed in this encounter.    There are no discontinued medications.  Follow-up: No Follow-up on file.   Crecencio Mc, MD

## 2015-11-02 NOTE — Progress Notes (Signed)
Pre-visit discussion using our clinic review tool. No additional management support is needed unless otherwise documented below in the visit note.  

## 2015-11-02 NOTE — Patient Instructions (Signed)
No changes to regimen unless your A1c is > 7.0 today  Low carb ice cream  has become more popular and there are now many more choices :  Halo Top  E enlightenment  Yazzo  frozen yogurt   Skinny Cow  Weight watchers     To make a low carb chip :  Take the Joseph's Lavash or Pita bread,  Or the [place] Low carb whole wheat tortilla   Place on metal cookie sheet  Brush with olive oil  Sprinkle garlic powder (NOT garlic salt), grated parmesan cheese, mediterranean seasoning , or all of them?  Bake at 275 for 30 minutes   We have substitutions for your potatoes!!  Try the mashed cauliflower and riced cauliflower dishes instead of rice and mashed potatoes  Mashed turnips are also very low carb!   For desserts :  Try the Dannon Lt n Fit greek yogurt dessert flavors and top with reddi Whip .  8 carbs,  80 calories  Try Oikos Triple Zero [place] Yogurt in the salted caramel, and the coffee flavors  With Whipped Cream for dessert  breyer's low carb ice cream, available in bars (on a stick, better ) or scoopable ice cream  HERE ARE THE LOW CARB  BREAD CHOICES

## 2015-11-02 NOTE — Telephone Encounter (Signed)
Pt unable to void at OV. Pt will come by another day to drop off a urine specimen. Future order place for urine microalbumin.

## 2015-11-03 LAB — COMPREHENSIVE METABOLIC PANEL
ALT: 25 U/L (ref 0–53)
AST: 15 U/L (ref 0–37)
Albumin: 4.3 g/dL (ref 3.5–5.2)
Alkaline Phosphatase: 72 U/L (ref 39–117)
BUN: 26 mg/dL — AB (ref 6–23)
CHLORIDE: 101 meq/L (ref 96–112)
CO2: 29 mEq/L (ref 19–32)
Calcium: 10 mg/dL (ref 8.4–10.5)
Creatinine, Ser: 1.02 mg/dL (ref 0.40–1.50)
GFR: 80.37 mL/min (ref >60.00)
GLUCOSE: 156 mg/dL — AB (ref 70–99)
POTASSIUM: 4 meq/L (ref 3.5–5.1)
SODIUM: 139 meq/L (ref 135–145)
Total Bilirubin: 0.9 mg/dL (ref 0.2–1.2)
Total Protein: 6.6 g/dL (ref 6.0–8.3)

## 2015-11-03 LAB — HEMOGLOBIN A1C: Hgb A1c MFr Bld: 6.3 % (ref 4.6–6.5)

## 2015-11-03 LAB — LDL CHOLESTEROL, DIRECT: Direct LDL: 59 mg/dL

## 2015-11-03 NOTE — Assessment & Plan Note (Signed)
Well controlled on current regimen. Renal function stable, no changes today.   Lab Results  Component Value Date   CREATININE 1.02 11/02/2015   Lab Results  Component Value Date   NA 139 11/02/2015   K 4.0 11/02/2015   CL 101 11/02/2015   CO2 29 11/02/2015

## 2015-11-03 NOTE — Assessment & Plan Note (Signed)
Well controlled on current statin therapy.   Liver enzymes are normal , no changes today.  Lab Results  Component Value Date   CHOL 144 04/29/2015   HDL 44.80 04/29/2015   LDLCALC 69 04/29/2015   LDLDIRECT 59.0 11/02/2015   TRIG 154.0* 04/29/2015   CHOLHDL 3 04/29/2015   Lab Results  Component Value Date   ALT 25 11/02/2015   AST 15 11/02/2015   ALKPHOS 72 11/02/2015   BILITOT 0.9 11/02/2015

## 2015-11-03 NOTE — Assessment & Plan Note (Signed)
Currently well-controlled on current medications .  hemoglobin A1c is at goal of less than 7.0 . Patient is reminded to schedule an annual eye exam and foot exam is normal today. Patient has minimal  microalbuminuria. Patient is tolerating statin therapy for CAD risk reduction and on ACE/ARB for renal protection and hypertension .  Lab Results  Component Value Date   HGBA1C 6.3 11/02/2015   Lab Results  Component Value Date   MICROALBUR 1.7 10/16/2014

## 2015-11-04 ENCOUNTER — Encounter: Payer: Self-pay | Admitting: Internal Medicine

## 2016-01-05 ENCOUNTER — Other Ambulatory Visit: Payer: Self-pay | Admitting: Internal Medicine

## 2016-02-11 ENCOUNTER — Other Ambulatory Visit: Payer: Self-pay | Admitting: Internal Medicine

## 2016-03-01 ENCOUNTER — Other Ambulatory Visit: Payer: Self-pay | Admitting: Internal Medicine

## 2016-03-01 MED ORDER — GLUCOSE BLOOD VI STRP: ORAL_STRIP | 5 refills | Status: DC

## 2016-04-07 ENCOUNTER — Other Ambulatory Visit: Payer: Self-pay | Admitting: Internal Medicine

## 2016-05-05 ENCOUNTER — Other Ambulatory Visit: Payer: Self-pay | Admitting: Internal Medicine

## 2016-07-05 ENCOUNTER — Other Ambulatory Visit: Payer: Self-pay | Admitting: Internal Medicine

## 2016-07-05 NOTE — Telephone Encounter (Signed)
I Refilled for 30 days only.  OFFICE VISIT NEEDED prior to any more refills please call and schedule

## 2016-07-05 NOTE — Telephone Encounter (Signed)
Pt never had 6 month labs to check A1C. Pt last OV and labs were on 11/02/15. Pt has no scheduled future appts. Ok to refill 30 day, contingent with OV and labs for any further refills?

## 2016-07-06 ENCOUNTER — Telehealth: Payer: Self-pay | Admitting: Radiology

## 2016-07-06 NOTE — Telephone Encounter (Signed)
Could you call patient and schedule OV for any further refills? Thank you.

## 2016-07-06 NOTE — Telephone Encounter (Signed)
Could you please call pt and schedule OV for further refills? Thank you.

## 2016-07-06 NOTE — Telephone Encounter (Signed)
Please disregard prev phone note, Rerouted to Santiago Glad.

## 2016-07-06 NOTE — Telephone Encounter (Signed)
Called pt and lm on vm to call office and make a 6 m follow up.

## 2016-07-12 ENCOUNTER — Telehealth: Payer: Self-pay | Admitting: Internal Medicine

## 2016-07-12 DIAGNOSIS — R809 Proteinuria, unspecified: Principal | ICD-10-CM

## 2016-07-12 DIAGNOSIS — E1129 Type 2 diabetes mellitus with other diabetic kidney complication: Secondary | ICD-10-CM

## 2016-07-12 DIAGNOSIS — E785 Hyperlipidemia, unspecified: Secondary | ICD-10-CM

## 2016-07-12 NOTE — Telephone Encounter (Signed)
Last A1c= 6.3 on 11/02/15 LOV: 11/02/15  Next OV 08/15/16  Ok to order A1c?

## 2016-07-12 NOTE — Telephone Encounter (Signed)
fASTING labs ordered, MUST GIVE URINE!!!! HAS NOT HAD URINALSYSIS SINC E 2016

## 2016-07-12 NOTE — Telephone Encounter (Signed)
Pt called and needs to have his A1C labs. Need orders please.   Call pt @ [phone]. Thank you!

## 2016-07-13 NOTE — Telephone Encounter (Signed)
Pt informed. Lab appt scheduled.

## 2016-08-01 ENCOUNTER — Other Ambulatory Visit: Payer: Self-pay | Admitting: Internal Medicine

## 2016-08-01 MED ORDER — METFORMIN HCL 850 MG PO TABS: 850.0000 mg | ORAL_TABLET | Freq: Two times a day (BID) | ORAL | 0 refills | Status: DC

## 2016-08-01 NOTE — Addendum Note (Signed)
Addended by: Cresenciano Lick on: 08/01/2016 04:43 PM   Modules accepted: Orders

## 2016-08-11 ENCOUNTER — Other Ambulatory Visit (INDEPENDENT_AMBULATORY_CARE_PROVIDER_SITE_OTHER): Payer: BLUE CROSS/BLUE SHIELD

## 2016-08-11 DIAGNOSIS — R809 Proteinuria, unspecified: Secondary | ICD-10-CM

## 2016-08-11 DIAGNOSIS — E1129 Type 2 diabetes mellitus with other diabetic kidney complication: Secondary | ICD-10-CM

## 2016-08-11 DIAGNOSIS — E785 Hyperlipidemia, unspecified: Secondary | ICD-10-CM | POA: Diagnosis not present

## 2016-08-11 LAB — MICROALBUMIN / CREATININE URINE RATIO
CREATININE, U: 138.6 mg/dL
Microalb Creat Ratio: 1.1 mg/g (ref 0.0–30.0)
Microalb, Ur: 1.5 mg/dL (ref 0.0–1.9)

## 2016-08-11 LAB — LIPID PANEL
CHOL/HDL RATIO: 3
Cholesterol: 131 mg/dL (ref 0–200)
HDL: 43.4 mg/dL (ref >39.00)
LDL Cholesterol: 58 mg/dL (ref 0–99)
NonHDL: 87.27
TRIGLYCERIDES: 148 mg/dL (ref 0.0–149.0)
VLDL: 29.6 mg/dL (ref 0.0–40.0)

## 2016-08-11 LAB — COMPREHENSIVE METABOLIC PANEL
ALT: 27 U/L (ref 0–53)
AST: 16 U/L (ref 0–37)
Albumin: 4.3 g/dL (ref 3.5–5.2)
Alkaline Phosphatase: 58 U/L (ref 39–117)
BILIRUBIN TOTAL: 0.9 mg/dL (ref 0.2–1.2)
BUN: 20 mg/dL (ref 6–23)
CALCIUM: 9.8 mg/dL (ref 8.4–10.5)
CHLORIDE: 104 meq/L (ref 96–112)
CO2: 27 mEq/L (ref 19–32)
CREATININE: 1 mg/dL (ref 0.40–1.50)
GFR: 82 mL/min (ref >60.00)
Glucose, Bld: 132 mg/dL — ABNORMAL HIGH (ref 70–99)
Potassium: 4.4 mEq/L (ref 3.5–5.1)
SODIUM: 143 meq/L (ref 135–145)
TOTAL PROTEIN: 6.6 g/dL (ref 6.0–8.3)

## 2016-08-11 LAB — LDL CHOLESTEROL, DIRECT: Direct LDL: 50 mg/dL

## 2016-08-11 LAB — HEMOGLOBIN A1C: HEMOGLOBIN A1C: 6.3 % (ref 4.6–6.5)

## 2016-08-14 ENCOUNTER — Encounter: Payer: Self-pay | Admitting: Internal Medicine

## 2016-08-15 ENCOUNTER — Encounter: Payer: Self-pay | Admitting: Internal Medicine

## 2016-08-15 ENCOUNTER — Ambulatory Visit (INDEPENDENT_AMBULATORY_CARE_PROVIDER_SITE_OTHER): Payer: BLUE CROSS/BLUE SHIELD | Admitting: Internal Medicine

## 2016-08-15 VITALS — BP 112/70 | HR 64 | Resp 16 | Ht 71.0 in | Wt 247.2 lb

## 2016-08-15 DIAGNOSIS — R809 Proteinuria, unspecified: Secondary | ICD-10-CM

## 2016-08-15 DIAGNOSIS — Z125 Encounter for screening for malignant neoplasm of prostate: Secondary | ICD-10-CM

## 2016-08-15 DIAGNOSIS — E1129 Type 2 diabetes mellitus with other diabetic kidney complication: Secondary | ICD-10-CM | POA: Diagnosis not present

## 2016-08-15 DIAGNOSIS — E785 Hyperlipidemia, unspecified: Secondary | ICD-10-CM

## 2016-08-15 DIAGNOSIS — I255 Ischemic cardiomyopathy: Secondary | ICD-10-CM

## 2016-08-15 DIAGNOSIS — Z1211 Encounter for screening for malignant neoplasm of colon: Secondary | ICD-10-CM

## 2016-08-15 DIAGNOSIS — E669 Obesity, unspecified: Secondary | ICD-10-CM

## 2016-08-15 MED ORDER — TRIAMCINOLONE ACETONIDE 0.1 % EX CREA: 1.0000 "application " | TOPICAL_CREAM | Freq: Two times a day (BID) | CUTANEOUS | 0 refills | Status: DC

## 2016-08-15 NOTE — Patient Instructions (Signed)
TRIAMCINOLONE CREAM SENT TO [facility] FOR EYELIDS,  TWICE DAILY  X 1 WEEKS,THEN STOP .  CONTINUE MOISTURIZING DAILY    The ShingRx vaccine will be available in about 6 months and IS ADVISED for all interested adults over 50 to prevent shingles

## 2016-08-15 NOTE — Progress Notes (Signed)
Subjective:  Patient ID: Todd Jenkins, male    DOB: 1961/03/25  Age: 56 y.o. MRN: [id number]  CC: The primary encounter diagnosis was Prostate cancer screening. Diagnoses of Hyperlipidemia with target LDL less than 70, Controlled type 2 diabetes mellitus with microalbuminuria, without long-term current use of insulin ([place]), Colon cancer screening, Obesity (BMI 30-39.9), and Ischemic cardiomyopathy were also pertinent to this visit.  HPI Todd Jenkins presents for  6  month follow up on diabetes.  Patient has no complaints today.  Patient is following a low glycemic index diet and taking all prescribed medications regularly without side effects.  Fasting sugars have been under less than 140 most of the time and post prandials have been under 160 except on rare occasions. Patient is exercising about 3 times per week and intentionally trying to lose weight .  Patient has had an eye exam in the last 12 months and checks feet regularly for signs of infection.  Patient does not walk barefoot outside,  And denies an numbness tingling or burning in feet. Patient is up to date on all recommended vaccinations.  EYE EXAM IS SCHEDULED FOR TOMORROW \\SEES  CARDIOLOGY IN April  HAD COLONOSCOPY IN 2012   5 YR FOLLOW UP IS DUE   AND NEEDS TO BE SCHEDULED   NOT EXERCISING   Lab Results  Component Value Date   HGBA1C 6.3 08/11/2016     Outpatient Medications Prior to Visit  Medication Sig Dispense Refill  . aspirin 81 MG tablet Take 81 mg by mouth daily.    Marland Kitchen atorvastatin (LIPITOR) 40 MG tablet TAKE 1 TABLET BY MOUTH DAILY. 90 tablet 1  . clopidogrel (PLAVIX) 75 MG tablet TAKE 1 TABLET BY MOUTH EVERY DAY 90 tablet 1  . enalapril-hydrochlorothiazide (VASERETIC) 10-25 MG tablet TAKE 1 TABLET BY MOUTH DAILY. 90 tablet 1  . glipiZIDE (GLUCOTROL) 10 MG tablet TAKE 1 TABLET (10 MG TOTAL) BY MOUTH 2 (TWO) TIMES DAILY BEFORE A MEAL. 180 tablet 1  . glucose blood (BAYER CONTOUR NEXT TEST) test strip  Use as instructed 100 each 5  . metFORMIN (GLUCOPHAGE) 850 MG tablet Take 1 tablet (850 mg total) by mouth 2 (two) times daily with a meal. 60 tablet 0  . metoprolol succinate (TOPROL-XL) 25 MG 24 hr tablet TAKE 1/2 TABLET BY MOUTH DAILY. 90 tablet 1  . nystatin-triamcinolone ointment (MYCOLOG) Apply 1 application topically 2 (two) times daily. 30 g 0  . sildenafil (VIAGRA) 100 MG tablet Take 0.5-1 tablets (50-100 mg total) by mouth daily as needed for erectile dysfunction. 5 tablet 11  . azelastine (OPTIVAR) 0.05 % ophthalmic solution Place 1 drop into both eyes 2 (two) times daily. (Patient not taking: Reported on 08/15/2016) 6 mL 12   No facility-administered medications prior to visit.     Review of Systems;  Patient denies headache, fevers, malaise, unintentional weight loss, skin rash, eye pain, sinus congestion and sinus pain, sore throat, dysphagia,  hemoptysis , cough, dyspnea, wheezing, chest pain, palpitations, orthopnea, edema, abdominal pain, nausea, melena, diarrhea, constipation, flank pain, dysuria, hematuria, urinary  Frequency, nocturia, numbness, tingling, seizures,  Focal weakness, Loss of consciousness,  Tremor, insomnia, depression, anxiety, and suicidal ideation.      Objective:  BP 112/70 (BP Location: Left Arm, Patient Position: Sitting, Cuff Size: Large)   Pulse 64   Resp 16   Ht 5\' 11"  (1.803 m)   Wt 247 lb 3.2 oz (112.1 kg)   SpO2 96%   BMI 34.48 kg/m  BP Readings from Last 3 Encounters:  08/15/16 112/70  11/02/15 108/66  06/30/15 126/82    Wt Readings from Last 3 Encounters:  08/15/16 247 lb 3.2 oz (112.1 kg)  11/02/15 244 lb 12 oz (111 kg)  06/30/15 253 lb 6.4 oz (114.9 kg)    General appearance: alert, cooperative and appears stated age Ears: normal TM's and external ear canals both ears Throat: lips, mucosa, and tongue normal; teeth and gums normal Neck: no adenopathy, no carotid bruit, supple, symmetrical, trachea midline and thyroid not  enlarged, symmetric, no tenderness/mass/nodules Back: symmetric, no curvature. ROM normal. No CVA tenderness. Lungs: clear to auscultation bilaterally Heart: regular rate and rhythm, S1, S2 normal, no murmur, click, rub or gallop Abdomen: soft, non-tender; bowel sounds normal; no masses,  no organomegaly Pulses: 2+ and symmetric Skin: Skin color, texture, turgor normal. No rashes or lesions Lymph nodes: Cervical, supraclavicular, and axillary nodes normal.  Lab Results  Component Value Date   HGBA1C 6.3 08/11/2016   HGBA1C 6.3 11/02/2015   HGBA1C 6.4 04/29/2015    Lab Results  Component Value Date   CREATININE 1.00 08/11/2016   CREATININE 1.02 11/02/2015   CREATININE 1.00 06/30/2015    Lab Results  Component Value Date   WBC 6.4 04/11/2013   HGB 14.6 04/11/2013   HCT 42.5 04/11/2013   PLT 212.0 04/11/2013   GLUCOSE 132 (H) 08/11/2016   CHOL 131 08/11/2016   TRIG 148.0 08/11/2016   HDL 43.40 08/11/2016   LDLDIRECT 50.0 08/11/2016   LDLCALC 58 08/11/2016   ALT 27 08/11/2016   AST 16 08/11/2016   NA 143 08/11/2016   K 4.4 08/11/2016   CL 104 08/11/2016   CREATININE 1.00 08/11/2016   BUN 20 08/11/2016   CO2 27 08/11/2016   TSH 3.83 04/29/2015   PSA 0.40 04/29/2015   HGBA1C 6.3 08/11/2016   MICROALBUR 1.5 08/11/2016    No results found.  Assessment & Plan:   Problem List Items Addressed This Visit    DM (diabetes mellitus) type II controlled with renal manifestation ([place])    Currently well-controlled on current medications .  hemoglobin A1c is at goal of less than 7.0 . Patient is reminded to schedule an annual eye exam and foot exam is normal today. Patient has minimal  microalbuminuria. Patient is tolerating statin therapy for CAD risk reduction and on ACE/ARB for renal protection and hypertension .  Lab Results  Component Value Date   HGBA1C 6.3 08/11/2016   Lab Results  Component Value Date   MICROALBUR 1.5 08/11/2016           Relevant Orders    Comprehensive metabolic panel   Hemoglobin A1c   Hyperlipidemia with target LDL less than 70    Well controlled on current statin therapy.   Liver enzymes are normal , no changes today.  Lab Results  Component Value Date   CHOL 131 08/11/2016   HDL 43.40 08/11/2016   LDLCALC 58 08/11/2016   LDLDIRECT 50.0 08/11/2016   TRIG 148.0 08/11/2016   CHOLHDL 3 08/11/2016   Lab Results  Component Value Date   ALT 27 08/11/2016   AST 16 08/11/2016   ALKPHOS 58 08/11/2016   BILITOT 0.9 08/11/2016                 Relevant Orders   Lipid panel   LDL cholesterol, direct   Ischemic cardiomyopathy    He has been asymptomatic, is taking his medications including daily aspirin, ACE inhibitor, beta  blocker and statin.  Continue semi annual follow up with Dr. Rockey Situ in January .  Lab Results  Component Value Date   CHOL 131 08/11/2016   HDL 43.40 08/11/2016   LDLCALC 58 08/11/2016   LDLDIRECT 50.0 08/11/2016   TRIG 148.0 08/11/2016   CHOLHDL 3 08/11/2016              Obesity (BMI 30-39.9)    I have addressed  BMI and recommended wt loss of 10% of body weight over the next 6 months using a low fat, low starch, high protein  fruit/vegetable based Mediterranean diet and 30 minutes of aerobic exercise a minimum of 5 days per week.          Other Visit Diagnoses    Prostate cancer screening    -  Primary   Relevant Orders   PSA   Colon cancer screening       Relevant Orders   Ambulatory referral to Gastroenterology      I have discontinued Mr. Couey's azelastine. I am also having him start on triamcinolone cream. Additionally, I am having him maintain his aspirin, sildenafil, nystatin-triamcinolone ointment, glucose blood, enalapril-hydrochlorothiazide, atorvastatin, clopidogrel, metoprolol succinate, glipiZIDE, and metFORMIN.  Meds ordered this encounter  Medications  . triamcinolone cream (KENALOG) 0.1 %    Sig: Apply 1 application topically 2 (two) times daily.      Dispense:  30 g    Refill:  0    Medications Discontinued During This Encounter  Medication Reason  . azelastine (OPTIVAR) 0.05 % ophthalmic solution Patient has not taken in last 30 days    Follow-up: Return in about 6 months (around 02/15/2017), or ANNUAL CPE.   Crecencio Mc, MD

## 2016-08-15 NOTE — Progress Notes (Signed)
Pre visit review using our clinic review tool, if applicable. No additional management support is needed unless otherwise documented below in the visit note. 

## 2016-08-16 LAB — HM DIABETES EYE EXAM

## 2016-08-16 NOTE — Assessment & Plan Note (Signed)
He has been asymptomatic, is taking his medications including daily aspirin, ACE inhibitor, beta blocker and statin.  Continue semi annual follow up with Dr. Rockey Situ in January .  Lab Results  Component Value Date   CHOL 131 08/11/2016   HDL 43.40 08/11/2016   LDLCALC 58 08/11/2016   LDLDIRECT 50.0 08/11/2016   TRIG 148.0 08/11/2016   CHOLHDL 3 08/11/2016

## 2016-08-16 NOTE — Assessment & Plan Note (Signed)
Currently well-controlled on current medications .  hemoglobin A1c is at goal of less than 7.0 . Patient is reminded to schedule an annual eye exam and foot exam is normal today. Patient has minimal  microalbuminuria. Patient is tolerating statin therapy for CAD risk reduction and on ACE/ARB for renal protection and hypertension .  Lab Results  Component Value Date   HGBA1C 6.3 08/11/2016   Lab Results  Component Value Date   MICROALBUR 1.5 08/11/2016

## 2016-08-16 NOTE — Assessment & Plan Note (Signed)
Well controlled on current statin therapy.   Liver enzymes are normal , no changes today.  Lab Results  Component Value Date   CHOL 131 08/11/2016   HDL 43.40 08/11/2016   LDLCALC 58 08/11/2016   LDLDIRECT 50.0 08/11/2016   TRIG 148.0 08/11/2016   CHOLHDL 3 08/11/2016   Lab Results  Component Value Date   ALT 27 08/11/2016   AST 16 08/11/2016   ALKPHOS 58 08/11/2016   BILITOT 0.9 08/11/2016

## 2016-08-16 NOTE — Assessment & Plan Note (Signed)
I have addressed  BMI and recommended wt loss of 10% of body weight over the next 6 months using a low fat, low starch, high protein  fruit/vegetable based Mediterranean diet and 30 minutes of aerobic exercise a minimum of 5 days per week.   

## 2016-08-25 NOTE — Progress Notes (Signed)
Cardiology Office Note  Date:  08/29/2016   ID:  Todd Jenkins, Todd Jenkins 05/18/1961, MRN [id number]  PCP:  Todd Mc, MD   Chief Complaint  Patient presents with  . other    OD f/u no complaints today. Meds reviewed verbally with pt.  Last seen by myself 03/2012 Seen by Todd Jenkins 05/2015  HPI:  Todd Jenkins is a 56 y.o. male with a history of  diabetes,  hyperlipidemia,  CAD  tobacco abuse  H/O non-ST elevation myocardial infarction (NSTEMI)  July 2012   s/p PTCA/ DES stent of 100% occluded LAD,  EF 40 to 45% in 2012 previous moderate to heavy alcohol use,  who presents for routine followup of his CAD  On today's visit he reports that he feels well Weight elevated,Difficulty losing weight No exercise OccasionalCigars, 2 to 3 drinks per night On asa and plavix, No heavy bruising or bleeding  Lab work reviewed personally by myself Total chol 131 LDL 58, TG 140s Direct LDL 50 HBA1C 6.3  Some days does not feel well, Thinks it is sugars, up and down No chest pain,  Sometimes misses breakfast, Has a piece of fruit  No significant shortness of breath with exertion.  He has been tolerating his medications without any difficulty.  Previous echocardiogram July 2012 Reviewed with him on today's visit showed ejection fraction 40-45%, moderate anterior wall hypokinesis, apical wall hypokinesis consistent with old anterior MI.Note echocardiogram done since his prior MI  Cardiac catheterization showed 99% mid LAD disease after the D1 vessel, ejection fraction 30-35%. Successful  xience 3.0 x 18 mm DES stent placed  EKG shows sinus bradycardia with rate 48 beats per minute with old anterior septal MI   PMH:   has a past medical history of Coronary artery disease; Erectile dysfunction; Hyperlipidemia; Hypertensive heart disease; Ischemic cardiomyopathy; Morbid obesity ([place]); and Type II diabetes mellitus ([place]).  PSH:    Past Surgical History:  Procedure  Laterality Date  . CARDIAC CATHETERIZATION  July 2012  . 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 aspirin 81 MG tablet Take 81 mg by mouth daily.    Marland Kitchen atorvastatin (LIPITOR) 40 MG tablet TAKE 1 TABLET BY MOUTH DAILY. 90 tablet 1  . clopidogrel (PLAVIX) 75 MG tablet TAKE 1 TABLET BY MOUTH EVERY DAY 90 tablet 1  . enalapril-hydrochlorothiazide (VASERETIC) 10-25 MG tablet TAKE 1 TABLET BY MOUTH DAILY. 90 tablet 1  . glipiZIDE (GLUCOTROL) 10 MG tablet TAKE 1 TABLET (10 MG TOTAL) BY MOUTH 2 (TWO) TIMES DAILY BEFORE A MEAL. 180 tablet 1  . glucose blood (BAYER CONTOUR NEXT TEST) test strip Use as instructed 100 each 5  . metFORMIN (GLUCOPHAGE) 850 MG tablet Take 1 tablet (850 mg total) by mouth 2 (two) times daily with a meal. 60 tablet 0  . metoprolol succinate (TOPROL-XL) 25 MG 24 hr tablet TAKE 1/2 TABLET BY MOUTH DAILY. 90 tablet 1  . nystatin-triamcinolone ointment (MYCOLOG) Apply 1 application topically 2 (two) times daily. 30 g 0  . sildenafil (VIAGRA) 100 MG tablet Take 0.5-1 tablets (50-100 mg total) by mouth daily as needed for erectile dysfunction. 5 tablet 11   No current facility-administered medications for this visit.      Allergies:   Patient has no known allergies.   Social History:  The patient  reports that he has been smoking  Cigars.  He has never used smokeless tobacco. He reports that he drinks alcohol. He reports that he does not use drugs.   Family History:   family history includes Cancer in his father and mother; Diabetes in his mother; Heart disease in his mother; Hypertension in his father.    Review of Systems: Review of Systems  Constitutional: Positive for malaise/fatigue.  Respiratory: Negative.   Cardiovascular: Negative.   Gastrointestinal: Negative.   Musculoskeletal: Negative.   Neurological: Negative.   Psychiatric/Behavioral: Negative.   All other  systems reviewed and are negative.    PHYSICAL EXAM: VS:  BP 120/70 (BP Location: Left Arm, Patient Position: Sitting, Cuff Size: Normal)   Pulse (!) 48   Ht 5\' 11"  (1.803 m)   Wt 245 lb 4 oz (111.2 kg)   BMI 34.21 kg/m  , BMI Body mass index is 34.21 kg/m. GEN: Well nourished, well developed, in no acute distress , Obese HEENT: normal  Neck: no JVD, carotid bruits, or masses Cardiac: Regular Rhythm, bradycardia no murmurs, rubs, or gallops,no edema  Respiratory:  clear to auscultation bilaterally, normal work of breathing GI: soft, nontender, nondistended, + BS MS: no deformity or atrophy  Skin: warm and dry, no rash Neuro:  Strength and sensation are intact Psych: euthymic mood, full affect    Recent Labs: 08/11/2016: ALT 27; BUN 20; Creatinine, Ser 1.00; Potassium 4.4; Sodium 143    Lipid Panel Lab Results  Component Value Date   CHOL 131 08/11/2016   HDL 43.40 08/11/2016   LDLCALC 58 08/11/2016   TRIG 148.0 08/11/2016      Wt Readings from Last 3 Encounters:  08/29/16 245 lb 4 oz (111.2 kg)  08/15/16 247 lb 3.2 oz (112.1 kg)  11/02/15 244 lb 12 oz (111 kg)       ASSESSMENT AND PLAN:  Coronary artery disease of native artery of native heart with stable angina pectoris ([place]) - Plan: EKG 12-Lead Currently with no symptoms of angina. No further workup at this time. Continue current medication regimen.  Hyperlipidemia with target LDL less than 70 - Plan: EKG 12-Lead Cholesterol is at goal on the current lipid regimen. No changes to the medications were made.  Hypertensive heart disease without heart failure - Plan: EKG 12-Lead Blood pressure is well controlled on today's visit. No changes made to the medications.  Ischemic cardiomyopathy - Plan: EKG 12-Lead Discussed previous cardiac function with him,  He does not want repeat echocardiogram at this time, reports that he feels fine  Controlled type 2 diabetes mellitus with microalbuminuria, without  long-term current use of insulin ([place]) - Plan: EKG 12-Lead Stable sugar numbers, A1c typically low 6 range Triglycerides trending downward  Bradycardia Asymptomatic, Suggested if he felt tired, he could hold the metoprolol   Total encounter time more than 25 minutes  Greater than 50% was spent in counseling and coordination of care with the patient   Disposition:   F/U  12 months   Orders Placed This Encounter  Procedures  . EKG 12-Lead     Signed, Esmond Plants, M.D., Ph.D. 08/29/2016  [place], [place]

## 2016-08-29 ENCOUNTER — Encounter: Payer: Self-pay | Admitting: Cardiovascular Disease

## 2016-08-29 ENCOUNTER — Ambulatory Visit (INDEPENDENT_AMBULATORY_CARE_PROVIDER_SITE_OTHER): Payer: BLUE CROSS/BLUE SHIELD | Admitting: Cardiovascular Disease

## 2016-08-29 VITALS — BP 120/70 | HR 48 | Ht 71.0 in | Wt 245.2 lb

## 2016-08-29 DIAGNOSIS — E1129 Type 2 diabetes mellitus with other diabetic kidney complication: Secondary | ICD-10-CM | POA: Diagnosis not present

## 2016-08-29 DIAGNOSIS — I25118 Atherosclerotic heart disease of native coronary artery with other forms of angina pectoris: Secondary | ICD-10-CM | POA: Diagnosis not present

## 2016-08-29 DIAGNOSIS — I119 Hypertensive heart disease without heart failure: Secondary | ICD-10-CM

## 2016-08-29 DIAGNOSIS — I255 Ischemic cardiomyopathy: Secondary | ICD-10-CM

## 2016-08-29 DIAGNOSIS — R809 Proteinuria, unspecified: Secondary | ICD-10-CM

## 2016-08-29 DIAGNOSIS — E785 Hyperlipidemia, unspecified: Secondary | ICD-10-CM

## 2016-08-29 NOTE — Patient Instructions (Signed)
Medication Instructions:   No medication changes made  Ok to hold metoprolol if heart rate gets too low, or for sx of fatigue  Labwork:  No new labs needed  Testing/Procedures:  No further testing at this time   I recommend watching educational videos on topics of interest to you at:       [email or web address]  Enter code: HEARTCARE    Follow-Up: It was a pleasure seeing you in the office today. Please call us if you have new issues that need to be addressed before your next appt.  [phone]  Your physician wants you to follow-up in: 12 months.  You will receive a reminder letter in the mail two months in advance. If you don't receive a letter, please call our office to schedule the follow-up appointment.  If you need a refill on your cardiac medications before your next appointment, please call your pharmacy.

## 2016-08-31 ENCOUNTER — Other Ambulatory Visit: Payer: Self-pay | Admitting: Internal Medicine

## 2016-10-08 ENCOUNTER — Other Ambulatory Visit: Payer: Self-pay | Admitting: Internal Medicine

## 2016-10-26 ENCOUNTER — Encounter: Payer: Self-pay | Admitting: Internal Medicine

## 2016-12-30 ENCOUNTER — Other Ambulatory Visit: Payer: Self-pay | Admitting: Internal Medicine

## 2017-01-28 ENCOUNTER — Other Ambulatory Visit: Payer: Self-pay | Admitting: Internal Medicine

## 2017-02-19 ENCOUNTER — Telehealth: Payer: Self-pay | Admitting: Internal Medicine

## 2017-02-19 NOTE — Telephone Encounter (Signed)
Appointment moved to from Monday to Tuesday 9:30 am Sept 24 via text confirmation

## 2017-02-20 ENCOUNTER — Encounter: Payer: BLUE CROSS/BLUE SHIELD | Admitting: Internal Medicine

## 2017-02-21 ENCOUNTER — Encounter: Payer: Self-pay | Admitting: Internal Medicine

## 2017-02-21 ENCOUNTER — Ambulatory Visit (INDEPENDENT_AMBULATORY_CARE_PROVIDER_SITE_OTHER): Payer: BLUE CROSS/BLUE SHIELD | Admitting: Internal Medicine

## 2017-02-21 VITALS — BP 104/76 | HR 49 | Temp 97.8°F | Resp 14 | Ht 71.0 in | Wt 246.0 lb

## 2017-02-21 DIAGNOSIS — E1129 Type 2 diabetes mellitus with other diabetic kidney complication: Secondary | ICD-10-CM | POA: Diagnosis not present

## 2017-02-21 DIAGNOSIS — E785 Hyperlipidemia, unspecified: Secondary | ICD-10-CM | POA: Diagnosis not present

## 2017-02-21 DIAGNOSIS — I255 Ischemic cardiomyopathy: Secondary | ICD-10-CM

## 2017-02-21 DIAGNOSIS — Z0000 Encounter for general adult medical examination without abnormal findings: Secondary | ICD-10-CM | POA: Diagnosis not present

## 2017-02-21 DIAGNOSIS — R809 Proteinuria, unspecified: Secondary | ICD-10-CM | POA: Diagnosis not present

## 2017-02-21 DIAGNOSIS — Z125 Encounter for screening for malignant neoplasm of prostate: Secondary | ICD-10-CM | POA: Diagnosis not present

## 2017-02-21 DIAGNOSIS — Z23 Encounter for immunization: Secondary | ICD-10-CM | POA: Diagnosis not present

## 2017-02-21 LAB — COMPREHENSIVE METABOLIC PANEL
ALK PHOS: 58 U/L (ref 39–117)
ALT: 23 U/L (ref 0–53)
AST: 14 U/L (ref 0–37)
Albumin: 4.2 g/dL (ref 3.5–5.2)
BUN: 24 mg/dL — ABNORMAL HIGH (ref 6–23)
CALCIUM: 9.5 mg/dL (ref 8.4–10.5)
CO2: 30 mEq/L (ref 19–32)
Chloride: 101 mEq/L (ref 96–112)
Creatinine, Ser: 1.03 mg/dL (ref 0.40–1.50)
GFR: 79.1 mL/min (ref >60.00)
GLUCOSE: 122 mg/dL — AB (ref 70–99)
POTASSIUM: 4.3 meq/L (ref 3.5–5.1)
Sodium: 138 mEq/L (ref 135–145)
TOTAL PROTEIN: 6.5 g/dL (ref 6.0–8.3)
Total Bilirubin: 0.9 mg/dL (ref 0.2–1.2)

## 2017-02-21 LAB — LIPID PANEL
CHOLESTEROL: 135 mg/dL (ref 0–200)
HDL: 45.9 mg/dL (ref >39.00)
LDL Cholesterol: 58 mg/dL (ref 0–99)
NonHDL: 88.95
TRIGLYCERIDES: 156 mg/dL — AB (ref 0.0–149.0)
Total CHOL/HDL Ratio: 3
VLDL: 31.2 mg/dL (ref 0.0–40.0)

## 2017-02-21 LAB — HEMOGLOBIN A1C: HEMOGLOBIN A1C: 6.4 % (ref 4.6–6.5)

## 2017-02-21 LAB — PSA: PSA: 0.51 ng/mL (ref 0.10–4.00)

## 2017-02-21 LAB — LDL CHOLESTEROL, DIRECT: LDL DIRECT: 54 mg/dL

## 2017-02-21 MED ORDER — TRIAMCINOLONE ACETONIDE 0.1 % EX CREA: 1.0000 "application " | TOPICAL_CREAM | Freq: Two times a day (BID) | CUTANEOUS | 0 refills | Status: DC

## 2017-02-21 NOTE — Progress Notes (Signed)
Patient ID: Todd Jenkins, male    DOB: April 25, 1961  Age: 56 y.o. MRN: [id number]  The patient is here for annual preventive examination and management of other chronic and acute problems.   The risk factors are reflected in the social history.  The roster of all physicians providing medical care to patient - is listed in the Snapshot section of the chart.  Activities of daily living:  The patient is 100% independent in all ADLs: dressing, toileting, feeding as well as independent mobility  Home safety : The patient has smoke detectors in the home. They wear seatbelts.  There are no firearms at home. There is no violence in the home.   There is no risks for hepatitis, STDs or HIV. There is no   history of blood transfusion. They have no travel history to infectious disease endemic areas of the world.  The patient has seen their dentist in the last six month. They have seen their eye doctor in the last year.    They do not  have excessive sun exposure. Discussed the need for sun protection: hats, long sleeves and use of sunscreen if there is significant sun exposure.   Diet: the importance of a healthy diet is discussed. They do have a healthy diet.  The benefits of regular aerobic exercise were discussed. She walks 4 times per week ,  20 minutes.   Depression screen: there are no signs or vegative symptoms of depression- irritability, change in appetite, anhedonia, sadness/tearfullness.  The following portions of the patient's history were reviewed and updated as appropriate: allergies, current medications, past family history, past medical history,  past surgical history, past social history  and problem list.  Visual acuity was not assessed per patient preference since she has regular follow up with her ophthalmologist. Hearing and body mass index were assessed and reviewed.   During the course of the visit the patient was educated and counseled about appropriate screening and  preventive services including : fall prevention , diabetes screening, nutrition counseling, colorectal cancer screening, and recommended immunizations.    CC: The primary encounter diagnosis was Prostate cancer screening. Diagnoses of Need for immunization against influenza, Controlled type 2 diabetes mellitus with microalbuminuria, without long-term current use of insulin ([place]), Hyperlipidemia with target LDL less than 70, Encounter for preventive health examination, and Ischemic cardiomyopathy were also pertinent to this visit.     6 month follow up on diabetes.  Patient has no complaints today.  Patient is following a low glycemic index diet and taking all prescribed medications regularly without side effects.  Fasting sugars have been under less than 140 most of the time and post prandials have been under 160 except on rare occasions. Patient is exercising about 3 times per week and intentionally trying to lose weight .  Patient has had an eye exam in the last 12 months and checks feet regularly for signs of infection.  Patient does not walk barefoot outside,  And denies an numbness tingling or burning in feet. Patient is up to date on all recommended vaccinations  Weight stable despite vacationing at [facility].   Eye appt up to date      History [place] has a past medical history of Coronary artery disease; Erectile dysfunction; Hyperlipidemia; Hypertensive heart disease; Ischemic cardiomyopathy; Morbid obesity ([place]); and Type II diabetes mellitus ([place]).   He has a past surgical history that includes Cardiac catheterization (July 2012) and Gastrostomy w/ feeding tube.   His family history includes  Cancer in his father and mother; Diabetes in his mother; Heart disease in his mother; Hypertension in his father.He reports that he has been smoking Cigars.  He has never used smokeless tobacco. He reports that he drinks alcohol. He reports that he does not use drugs.  Outpatient Medications Prior  to Visit  Medication Sig Dispense Refill  . aspirin 81 MG tablet Take 81 mg by mouth daily.    Marland Kitchen atorvastatin (LIPITOR) 40 MG tablet TAKE 1 TABLET BY MOUTH DAILY. 90 tablet 1  . clopidogrel (PLAVIX) 75 MG tablet TAKE 1 TABLET BY MOUTH EVERY DAY 90 tablet 1  . enalapril-hydrochlorothiazide (VASERETIC) 10-25 MG tablet TAKE 1 TABLET BY MOUTH DAILY. 90 tablet 1  . glipiZIDE (GLUCOTROL) 10 MG tablet TAKE 1 TABLET (10 MG TOTAL) BY MOUTH 2 (TWO) TIMES DAILY BEFORE A MEAL. 180 tablet 1  . glucose blood (BAYER CONTOUR NEXT TEST) test strip Use as instructed 100 each 5  . metFORMIN (GLUCOPHAGE) 850 MG tablet TAKE 1 TABLET (850 MG TOTAL) BY MOUTH 2 (TWO) TIMES DAILY WITH A MEAL.(NEED APPT FOR FURTHER REFILLS 60 tablet 5  . metoprolol succinate (TOPROL-XL) 25 MG 24 hr tablet TAKE 1/2 TABLET BY MOUTH DAILY. 90 tablet 1  . nystatin-triamcinolone ointment (MYCOLOG) Apply 1 application topically 2 (two) times daily. 30 g 0  . sildenafil (VIAGRA) 100 MG tablet Take 0.5-1 tablets (50-100 mg total) by mouth daily as needed for erectile dysfunction. 5 tablet 11   No facility-administered medications prior to visit.     Review of Systems   Patient denies headache, fevers, malaise, unintentional weight loss, skin rash, eye pain, sinus congestion and sinus pain, sore throat, dysphagia,  hemoptysis , cough, dyspnea, wheezing, chest pain, palpitations, orthopnea, edema, abdominal pain, nausea, melena, diarrhea, constipation, flank pain, dysuria, hematuria, urinary  Frequency, nocturia, numbness, tingling, seizures,  Focal weakness, Loss of consciousness,  Tremor, insomnia, depression, anxiety, and suicidal ideation.      Objective:  BP 104/76 (BP Location: Left Arm, Patient Position: Sitting, Cuff Size: Normal)   Pulse (!) 49   Temp 97.8 F (36.6 C) (Oral)   Resp 14   Ht 5\' 11"  (1.803 m)   Wt 246 lb (111.6 kg)   SpO2 98%   BMI 34.31 kg/m   Physical Exam   General appearance: alert, cooperative and appears  stated age Ears: normal TM's and external ear canals both ears Throat: lips, mucosa, and tongue normal; teeth and gums normal Neck: no adenopathy, no carotid bruit, supple, symmetrical, trachea midline and thyroid not enlarged, symmetric, no tenderness/mass/nodules Back: symmetric, no curvature. ROM normal. No CVA tenderness. Lungs: clear to auscultation bilaterally Heart: regular rate and rhythm, S1, S2 normal, no murmur, click, rub or gallop Abdomen: soft, non-tender; bowel sounds normal; no masses,  no organomegaly Pulses: 2+ and symmetric Skin: Skin color, texture, turgor normal. No rashes or lesions Lymph nodes: Cervical, supraclavicular, and axillary nodes normal.    Assessment & Plan:   Problem List Items Addressed This Visit    DM (diabetes mellitus) type II controlled with renal manifestation ([facility])    Currently well-controlled on current medications .  hemoglobin A1c is at goal of less than 7.0 . Patient is reminded to schedule an annual eye exam and foot exam is normal today. Patient has minimal  microalbuminuria. Patient is tolerating statin therapy for CAD risk reduction and on ACE/ARB for renal protection and hypertension .  Lab Results  Component Value Date   HGBA1C 6.4 02/21/2017  Lab Results  Component Value Date   MICROALBUR 1.5 08/11/2016           Encounter for preventive health examination    Annual comprehensive preventive exam was done as well as an evaluation and management of acute and chronic conditions .  During the course of the visit the patient was educated and counseled about appropriate screening and preventive services including :  diabetes screening, lipid analysis with projected  10 year  risk for CAD , nutrition counseling, prostate and colorectal cancer screening, and recommended immunizations.  Printed recommendations for health maintenance screenings was given.       Hyperlipidemia with target LDL less than 70    Well controlled on current  statin therapy.   Liver enzymes are normal , no changes today.  Lab Results  Component Value Date   CHOL 135 02/21/2017   HDL 45.90 02/21/2017   LDLCALC 58 02/21/2017   LDLDIRECT 54.0 02/21/2017   TRIG 156.0 (H) 02/21/2017   CHOLHDL 3 02/21/2017   Lab Results  Component Value Date   ALT 23 02/21/2017   AST 14 02/21/2017   ALKPHOS 58 02/21/2017   BILITOT 0.9 02/21/2017                 Ischemic cardiomyopathy    He has been asymptomatic, is taking his medications including daily aspirin, ACE inhibitor, beta blocker and statin.  Continue semi annual follow up with Dr. Rockey Situ.  Lab Results  Component Value Date   CHOL 135 02/21/2017   HDL 45.90 02/21/2017   LDLCALC 58 02/21/2017   LDLDIRECT 54.0 02/21/2017   TRIG 156.0 (H) 02/21/2017   CHOLHDL 3 02/21/2017               Other Visit Diagnoses    Prostate cancer screening    -  Primary   Relevant Orders   PSA (Completed)   Need for immunization against influenza       Relevant Orders   Flu Vaccine QUAD 36+ mos IM (Completed)      I have changed Mr. Learn's triamcinolone cream. I am also having him maintain his aspirin, sildenafil, nystatin-triamcinolone ointment, glucose blood, metoprolol succinate, enalapril-hydrochlorothiazide, clopidogrel, atorvastatin, glipiZIDE, and metFORMIN.  Meds ordered this encounter  Medications  . triamcinolone cream (KENALOG) 0.1 %    Sig: Apply 1 application topically 2 (two) times daily. As needed    Dispense:  30 g    Refill:  0    There are no discontinued medications.  Follow-up: No Follow-up on file.   Crecencio Mc, MD

## 2017-02-21 NOTE — Patient Instructions (Signed)
No changes to regimen today unless labs suggest a need to  See you in 6 months

## 2017-02-22 NOTE — Assessment & Plan Note (Signed)
Currently well-controlled on current medications .  hemoglobin A1c is at goal of less than 7.0 . Patient is reminded to schedule an annual eye exam and foot exam is normal today. Patient has minimal  microalbuminuria. Patient is tolerating statin therapy for CAD risk reduction and on ACE/ARB for renal protection and hypertension .  Lab Results  Component Value Date   HGBA1C 6.4 02/21/2017   Lab Results  Component Value Date   MICROALBUR 1.5 08/11/2016

## 2017-02-22 NOTE — Assessment & Plan Note (Signed)

## 2017-02-22 NOTE — Assessment & Plan Note (Signed)
He has been asymptomatic, is taking his medications including daily aspirin, ACE inhibitor, beta blocker and statin.  Continue semi annual follow up with Dr. Rockey Situ.  Lab Results  Component Value Date   CHOL 135 02/21/2017   HDL 45.90 02/21/2017   LDLCALC 58 02/21/2017   LDLDIRECT 54.0 02/21/2017   TRIG 156.0 (H) 02/21/2017   CHOLHDL 3 02/21/2017

## 2017-02-22 NOTE — Assessment & Plan Note (Signed)
Well controlled on current statin therapy.   Liver enzymes are normal , no changes today.  Lab Results  Component Value Date   CHOL 135 02/21/2017   HDL 45.90 02/21/2017   LDLCALC 58 02/21/2017   LDLDIRECT 54.0 02/21/2017   TRIG 156.0 (H) 02/21/2017   CHOLHDL 3 02/21/2017   Lab Results  Component Value Date   ALT 23 02/21/2017   AST 14 02/21/2017   ALKPHOS 58 02/21/2017   BILITOT 0.9 02/21/2017

## 2017-03-29 ENCOUNTER — Other Ambulatory Visit: Payer: Self-pay | Admitting: Internal Medicine

## 2017-04-07 ENCOUNTER — Other Ambulatory Visit: Payer: Self-pay | Admitting: Internal Medicine

## 2017-05-12 ENCOUNTER — Other Ambulatory Visit: Payer: Self-pay | Admitting: Internal Medicine

## 2017-06-23 ENCOUNTER — Other Ambulatory Visit: Payer: Self-pay | Admitting: Internal Medicine

## 2017-07-06 ENCOUNTER — Other Ambulatory Visit: Payer: Self-pay

## 2017-07-06 MED ORDER — ATORVASTATIN CALCIUM 40 MG PO TABS: 40.0000 mg | ORAL_TABLET | Freq: Every day | ORAL | 1 refills | Status: DC

## 2017-07-07 ENCOUNTER — Other Ambulatory Visit: Payer: Self-pay

## 2017-07-07 MED ORDER — CLOPIDOGREL BISULFATE 75 MG PO TABS: 75.0000 mg | ORAL_TABLET | Freq: Every day | ORAL | 1 refills | Status: DC

## 2017-08-08 ENCOUNTER — Other Ambulatory Visit: Payer: Self-pay | Admitting: Internal Medicine

## 2017-08-21 ENCOUNTER — Ambulatory Visit: Payer: Managed Care, Other (non HMO) | Admitting: Internal Medicine

## 2017-08-21 ENCOUNTER — Encounter: Payer: Self-pay | Admitting: Internal Medicine

## 2017-08-21 VITALS — BP 118/70 | HR 49 | Temp 97.9°F | Resp 14 | Ht 71.0 in | Wt 251.8 lb

## 2017-08-21 DIAGNOSIS — R001 Bradycardia, unspecified: Secondary | ICD-10-CM | POA: Diagnosis not present

## 2017-08-21 DIAGNOSIS — E785 Hyperlipidemia, unspecified: Secondary | ICD-10-CM

## 2017-08-21 DIAGNOSIS — Z79899 Other long term (current) drug therapy: Secondary | ICD-10-CM | POA: Diagnosis not present

## 2017-08-21 DIAGNOSIS — R809 Proteinuria, unspecified: Secondary | ICD-10-CM | POA: Diagnosis not present

## 2017-08-21 DIAGNOSIS — E669 Obesity, unspecified: Secondary | ICD-10-CM | POA: Diagnosis not present

## 2017-08-21 DIAGNOSIS — R601 Generalized edema: Secondary | ICD-10-CM | POA: Diagnosis not present

## 2017-08-21 DIAGNOSIS — E66811 Obesity, class 1: Secondary | ICD-10-CM

## 2017-08-21 DIAGNOSIS — E1129 Type 2 diabetes mellitus with other diabetic kidney complication: Secondary | ICD-10-CM | POA: Diagnosis not present

## 2017-08-21 DIAGNOSIS — I119 Hypertensive heart disease without heart failure: Secondary | ICD-10-CM | POA: Diagnosis not present

## 2017-08-21 DIAGNOSIS — E538 Deficiency of other specified B group vitamins: Secondary | ICD-10-CM | POA: Diagnosis not present

## 2017-08-21 DIAGNOSIS — R609 Edema, unspecified: Secondary | ICD-10-CM | POA: Insufficient documentation

## 2017-08-21 LAB — LIPID PANEL
CHOLESTEROL: 127 mg/dL (ref 0–200)
HDL: 46.1 mg/dL (ref >39.00)
LDL Cholesterol: 46 mg/dL (ref 0–99)
NonHDL: 80.89
TRIGLYCERIDES: 174 mg/dL — AB (ref 0.0–149.0)
Total CHOL/HDL Ratio: 3
VLDL: 34.8 mg/dL (ref 0.0–40.0)

## 2017-08-21 LAB — VITAMIN B12: Vitamin B-12: 201 pg/mL — ABNORMAL LOW (ref 211–911)

## 2017-08-21 LAB — COMPREHENSIVE METABOLIC PANEL
ALBUMIN: 3.7 g/dL (ref 3.5–5.2)
ALK PHOS: 62 U/L (ref 39–117)
ALT: 25 U/L (ref 0–53)
AST: 14 U/L (ref 0–37)
BILIRUBIN TOTAL: 0.6 mg/dL (ref 0.2–1.2)
BUN: 25 mg/dL — AB (ref 6–23)
CHLORIDE: 106 meq/L (ref 96–112)
CO2: 27 mEq/L (ref 19–32)
CREATININE: 0.97 mg/dL (ref 0.40–1.50)
Calcium: 9.1 mg/dL (ref 8.4–10.5)
GFR: 84.62 mL/min (ref >60.00)
Glucose, Bld: 172 mg/dL — ABNORMAL HIGH (ref 70–99)
Potassium: 4.4 mEq/L (ref 3.5–5.1)
SODIUM: 142 meq/L (ref 135–145)
TOTAL PROTEIN: 6.8 g/dL (ref 6.0–8.3)

## 2017-08-21 LAB — CBC WITH DIFFERENTIAL/PLATELET
BASOS PCT: 0.5 % (ref 0.0–3.0)
Basophils Absolute: 0 10*3/uL (ref 0.0–0.1)
EOS ABS: 0.3 10*3/uL (ref 0.0–0.7)
Eosinophils Relative: 4.8 % (ref 0.0–5.0)
HEMATOCRIT: 40 % (ref 39.0–52.0)
Hemoglobin: 13.6 g/dL (ref 13.0–17.0)
LYMPHS ABS: 1.8 10*3/uL (ref 0.7–4.0)
Lymphocytes Relative: 28.2 % (ref 12.0–46.0)
MCHC: 34 g/dL (ref 30.0–36.0)
MCV: 87.4 fl (ref 78.0–100.0)
Monocytes Absolute: 0.6 10*3/uL (ref 0.1–1.0)
Monocytes Relative: 8.8 % (ref 3.0–12.0)
NEUTROS ABS: 3.6 10*3/uL (ref 1.4–7.7)
Neutrophils Relative %: 57.7 % (ref 43.0–77.0)
PLATELETS: 212 10*3/uL (ref 150.0–400.0)
RBC: 4.58 Mil/uL (ref 4.22–5.81)
RDW: 14.1 % (ref 11.5–15.5)
WBC: 6.2 10*3/uL (ref 4.0–10.5)

## 2017-08-21 LAB — MICROALBUMIN / CREATININE URINE RATIO
Creatinine,U: 160 mg/dL
MICROALB UR: 2.7 mg/dL — AB (ref 0.0–1.9)
Microalb Creat Ratio: 1.7 mg/g (ref 0.0–30.0)

## 2017-08-21 LAB — HEMOGLOBIN A1C: HEMOGLOBIN A1C: 6.3 % (ref 4.6–6.5)

## 2017-08-21 MED ORDER — FUROSEMIDE 20 MG PO TABS: 20.0000 mg | ORAL_TABLET | Freq: Every day | ORAL | 3 refills | Status: DC

## 2017-08-21 NOTE — Assessment & Plan Note (Signed)
Well controlled on current regimen. Renal function stable, no changes today. 

## 2017-08-21 NOTE — Patient Instructions (Signed)
There is a newer class of diabetes medications that has a very protective effect  On the heart The most studied drug In this class is called "Jardiance"  I think it would be a good alternative to glipizide , especially if your low blood sugars prevent you from eating health and exercising.   Empagliflozin oral tablets What is this medicine? EMPAGLIFLOZIN (EM pa gli FLOE zin) helps to treat type 2 diabetes. It helps to control blood sugar. This drug may also reduce the risk of heart attack or stroke if you have type 2 diabetes and risk factors for heart disease. Treatment is combined with diet and exercise. This medicine may be used for other purposes; ask your health care provider or pharmacist if you have questions. COMMON BRAND NAME(S): JARDIANCE What should I tell my health care provider before I take this medicine? They need to know if you have any of these conditions: -dehydration -diabetic ketoacidosis -diet low in salt -eating less due to illness, surgery, dieting, or any other reason -having surgery -high cholesterol -high levels of potassium in the blood -history of pancreatitis or pancreas problems -history of yeast infection of the penis or vagina -if you often drink alcohol -infections in the bladder, kidneys, or urinary tract -kidney disease -liver disease -low blood pressure -on hemodialysis -problems urinating -type 1 diabetes -uncircumcised male -an unusual or allergic reaction to empagliflozin, other medicines, foods, dyes, or preservatives -pregnant or trying to get pregnant -breast-feeding How should I use this medicine? Take this medicine by mouth with a glass of water. Follow the directions on the prescription label. Take it in the morning, with or without food. Take your dose at the same time each day. Do not take more often than directed. Do not stop taking except on your doctor's advice. Talk to your pediatrician regarding the use of this medicine in  children. Special care may be needed. Overdosage: If you think you have taken too much of this medicine contact a poison control center or emergency room at once. NOTE: This medicine is only for you. Do not share this medicine with others. What if I miss a dose? If you miss a dose, take it as soon as you can. If it is almost time for your next dose, take only that dose. Do not take double or extra doses. What may interact with this medicine? Do not take this medicine with any of the following medications: -gatifloxacin This medicine may also interact with the following medications: -alcohol -certain medicines for blood pressure, heart disease -diuretics This list may not describe all possible interactions. Give your health care provider a list of all the medicines, herbs, non-prescription drugs, or dietary supplements you use. Also tell them if you smoke, drink alcohol, or use illegal drugs. Some items may interact with your medicine. What should I watch for while using this medicine? Visit your doctor or health care professional for regular checks on your progress. This medicine can cause a serious condition in which there is too much acid in the blood. If you develop nausea, vomiting, stomach pain, unusual tiredness, or breathing problems, stop taking this medicine and call your doctor right away. If possible, use a ketone dipstick to check for ketones in your urine. A test called the HbA1C (A1C) will be monitored. This is a simple blood test. It measures your blood sugar control over the last 2 to 3 months. You will receive this test every 3 to 6 months. Learn how to check your blood  sugar. Learn the symptoms of low and high blood sugar and how to manage them. Always carry a quick-source of sugar with you in case you have symptoms of low blood sugar. Examples include hard sugar candy or glucose tablets. Make sure others know that you can choke if you eat or drink when you develop serious  symptoms of low blood sugar, such as seizures or unconsciousness. They must get medical help at once. Tell your doctor or health care professional if you have high blood sugar. You might need to change the dose of your medicine. If you are sick or exercising more than usual, you might need to change the dose of your medicine. Do not skip meals. Ask your doctor or health care professional if you should avoid alcohol. Many nonprescription cough and cold products contain sugar or alcohol. These can affect blood sugar. Wear a medical ID bracelet or chain, and carry a card that describes your disease and details of your medicine and dosage times. What side effects may I notice from receiving this medicine? Side effects that you should report to your doctor or health care professional as soon as possible: -allergic reactions like skin rash, itching or hives, swelling of the face, lips, or tongue -breathing problems -dizziness -fast or irregular heartbeat -feeling faint or lightheaded, falls -muscle weakness -nausea, vomiting, unusual stomach upset or pain -signs and symptoms of low blood sugar such as feeling anxious, confusion, dizziness, increased hunger, unusually weak or tired, sweating, shakiness, cold, irritable, headache, blurred vision, fast heartbeat, loss of consciousness -signs and symptoms of a urinary tract infection, such as fever, chills, a burning feeling when urinating, blood in the urine, back pain -trouble passing urine or change in the amount of urine, including an urgent need to urinate more often, in larger amounts, or at night -penile discharge, itching, or pain in men -unusual tiredness -vaginal discharge, itching, or odor in women Side effects that usually do not require medical attention (report to your doctor or health care professional if they continue or are bothersome): -joint pain -mild increase in urination -thirsty This list may not describe all possible side  effects. Call your doctor for medical advice about side effects. You may report side effects to FDA at 1-800-FDA-1088. Where should I keep my medicine? Keep out of the reach of children. Store at room temperature between 20 and 25 degrees C (68 and 77 degrees F). Throw away any unused medicine after the expiration date. NOTE: This sheet is a summary. It may not cover all possible information. If you have questions about this medicine, talk to your doctor, pharmacist, or health care provider.  2018 [employer]vier/Gold Standard (2015-06-18 11:46:10)

## 2017-08-21 NOTE — Progress Notes (Addendum)
Subjective:  Patient ID: Todd Jenkins, male    DOB: [birth year]  Age: 57 y.o. MRN: [id number]  CC: The primary encounter diagnosis was Hyperlipidemia with target LDL less than 70. Diagnoses of Controlled type 2 diabetes mellitus with microalbuminuria, without long-term current use of insulin ([place]), Long-term use of high-risk medication, Generalized edema, Bradycardia, sinus, Hypertensive heart disease without heart failure, Obesity (BMI 30.0-34.9), and B12 deficiency were also pertinent to this visit.  HPI [name] presents for 6 month follow up on diabetes with microalbuminuria  And CAD.  Patient has no complaints today.  Patient is following a low glycemic index diet most of the time and taking all prescribed medications regularly .  He has had episdes of low blood sugar (not below 70) after golfing and after eating very low carb dinners (fish and veggies)  These have not occurred more than once a week . This morning's FBS was 170 after eating guacamole and chips last night during the [employer] basketball game .  His glucometer states that he is averaging 125 for his blood sugar. Patient is exercising about 3 times per week and intentionally trying to lose weight .  Patient has not had an eye exam since March 2018.  He  checks feet regularly for signs of infection.  Patient does not walk barefoot outside,  And denies an numbness tingling or burning in feet. Patient is up to date on all recommended vaccinations  Lab Results  Component Value Date   MICROALBUR 2.7 (H) 08/21/2017   Fluid retention.  Denies orthopnea.   Nor short of breath on the [place] when he walks the course.  Feels puffy when he wakes up.  Reviewed his salt intake.  Outpatient Medications Prior to Visit  Medication Sig Dispense Refill  . aspirin 81 MG tablet Take 81 mg by mouth daily.    Marland Kitchen atorvastatin (LIPITOR) 40 MG tablet Take 1 tablet (40 mg total) by mouth daily. 90 tablet 1  . clopidogrel (PLAVIX) 75 MG  tablet Take 1 tablet (75 mg total) by mouth daily. 90 tablet 1  . CONTOUR NEXT TEST test strip USE AS INSTRUCTED 100 each 3  . enalapril-hydrochlorothiazide (VASERETIC) 10-25 MG tablet TAKE 1 TABLET BY MOUTH DAILY. 90 tablet 1  . glipiZIDE (GLUCOTROL) 10 MG tablet TAKE 1 TABLET (10 MG TOTAL) BY MOUTH 2 (TWO) TIMES DAILY BEFORE A MEAL. 180 tablet 1  . metFORMIN (GLUCOPHAGE) 850 MG tablet TAKE 1 TABLET (850 MG TOTAL) BY MOUTH 2 (TWO) TIMES DAILY WITH A MEAL.(NEED APPT FOR FURTHER REFILLS 60 tablet 0  . metoprolol succinate (TOPROL-XL) 25 MG 24 hr tablet TAKE 1/2 TABLET BY MOUTH DAILY. 90 tablet 1  . nystatin-triamcinolone ointment (MYCOLOG) Apply 1 application topically 2 (two) times daily. 30 g 0  . sildenafil (VIAGRA) 100 MG tablet Take 0.5-1 tablets (50-100 mg total) by mouth daily as needed for erectile dysfunction. 5 tablet 11  . triamcinolone cream (KENALOG) 0.1 % Apply 1 application topically 2 (two) times daily. As needed 30 g 0   No facility-administered medications prior to visit.     Review of Systems;  Patient denies headache, fevers, malaise, unintentional weight loss, skin rash, eye pain, sinus congestion and sinus pain, sore throat, dysphagia,  hemoptysis , cough, dyspnea, wheezing, chest pain, palpitations, orthopnea, edema, abdominal pain, nausea, melena, diarrhea, constipation, flank pain, dysuria, hematuria, urinary  Frequency, nocturia, numbness, tingling, seizures,  Focal weakness, Loss of consciousness,  Tremor, insomnia, depression, anxiety, and suicidal ideation.  Objective:  BP 118/70 (BP Location: Left Arm, Patient Position: Sitting, Cuff Size: Large)   Pulse (!) 49   Temp 97.9 F (36.6 C) (Oral)   Resp 14   Ht 5\' 11"  (1.803 m)   Wt 251 lb 12.8 oz (114.2 kg)   SpO2 97%   BMI 35.12 kg/m   BP Readings from Last 3 Encounters:  08/21/17 118/70  02/21/17 104/76  08/29/16 120/70    Wt Readings from Last 3 Encounters:  08/21/17 251 lb 12.8 oz (114.2 kg)    02/21/17 246 lb (111.6 kg)  08/29/16 245 lb 4 oz (111.2 kg)    General appearance: alert, cooperative and appears stated age Ears: normal TM's and external ear canals both ears Throat: lips, mucosa, and tongue normal; teeth and gums normal Neck: no adenopathy, no carotid bruit, supple, symmetrical, trachea midline and thyroid not enlarged, symmetric, no tenderness/mass/nodules Back: symmetric, no curvature. ROM normal. No CVA tenderness. Lungs: clear to auscultation bilaterally Heart: regular rate and rhythm, S1, S2 normal, no murmur, click, rub or gallop Abdomen: soft, non-tender; bowel sounds normal; no masses,  no organomegaly Pulses: 2+ and symmetric Skin: Skin color, texture, turgor normal. No rashes or lesions Lymph nodes: Cervical, supraclavicular, and axillary nodes normal.  Lab Results  Component Value Date   HGBA1C 6.3 08/21/2017   HGBA1C 6.4 02/21/2017   HGBA1C 6.3 08/11/2016    Lab Results  Component Value Date   CREATININE 0.97 08/21/2017   CREATININE 1.03 02/21/2017   CREATININE 1.00 08/11/2016    Lab Results  Component Value Date   WBC 6.2 08/21/2017   HGB 13.6 08/21/2017   HCT 40.0 08/21/2017   PLT 212.0 08/21/2017   GLUCOSE 172 (H) 08/21/2017   CHOL 127 08/21/2017   TRIG 174.0 (H) 08/21/2017   HDL 46.10 08/21/2017   LDLDIRECT 54.0 02/21/2017   LDLCALC 46 08/21/2017   ALT 25 08/21/2017   AST 14 08/21/2017   NA 142 08/21/2017   K 4.4 08/21/2017   CL 106 08/21/2017   CREATININE 0.97 08/21/2017   BUN 25 (H) 08/21/2017   CO2 27 08/21/2017   TSH 3.83 04/29/2015   PSA 0.51 02/21/2017   HGBA1C 6.3 08/21/2017   MICROALBUR 2.7 (H) 08/21/2017    No results found.  Assessment & Plan:   Problem List Items Addressed This Visit    Hyperlipidemia with target LDL less than 70 - Primary   Relevant Medications   furosemide (LASIX) 20 MG tablet   Other Relevant Orders   Lipid panel (Completed)   Obesity (BMI 30.0-34.9)    I have addressed  BMI and  recommended wt loss of 10% of body weight over the next 6 months using a low glycemic index diet and regular exercise a minimum of 5 days per week.        Long-term use of high-risk medication    Patient takes asa, Plavix and metformin  Screening for anemia and b12 deficiency has not been done in over 3 years.       Relevant Orders   CBC with Differential/Platelet (Completed)   Vitamin B12 (Completed)   Hypertensive heart disease    Well controlled on current regimen. Renal function stable, no changes today.        Relevant Medications   furosemide (LASIX) 20 MG tablet   Edema    Secondary to high salt diet.  Recommended reducing salt in diet and using furosemide prn (not more than 2/week)  DM (diabetes mellitus) type II controlled with renal manifestation ([place])    Historically well-controlled on current medications, with recent onset of hypoglycemia caused by glipizide .  Discussed alternative therapy with Jardiance, including the beneficial effects on CAD.  Printed information given  .  No medication changes pending review of today's labs.  Patient has history of  minimal  microalbuminuria. Patient is tolerating statin therapy for CAD risk reduction and on ACE/ARB for renal protection and hypertension .  Lab Results  Component Value Date   HGBA1C 6.4 02/21/2017   Lab Results  Component Value Date   MICROALBUR 1.5 08/11/2016           Relevant Orders   Hemoglobin A1c (Completed)   Comprehensive metabolic panel (Completed)   Microalbumin / creatinine urine ratio (Completed)   Bradycardia, sinus    Secondary to low dose metoprolol  . He denies dizzine and notes an appropriate increase in pulse when he walks or exercises.      Relevant Medications   furosemide (LASIX) 20 MG tablet   B12 deficiency    Likely due to metformin,   But will check rbc folate and intrinsic factor ab test with first RN visit for B12 injections.        Relevant Orders   RBC Folate    Intrinsic Factor Antibodies      I am having Loyal Gambler. Mccready start on furosemide. I am also having him maintain his aspirin, sildenafil, nystatin-triamcinolone ointment, triamcinolone cream, metoprolol succinate, CONTOUR NEXT TEST, enalapril-hydrochlorothiazide, glipiZIDE, atorvastatin, clopidogrel, and metFORMIN.  Meds ordered this encounter  Medications  . furosemide (LASIX) 20 MG tablet    Sig: Take 1 tablet (20 mg total) by mouth daily. As needed for fluid retention    Dispense:  30 tablet    Refill:  3    There are no discontinued medications.  Follow-up: Return in about 6 months (around 02/21/2018) for follow up diabetes.   Crecencio Mc, MD

## 2017-08-21 NOTE — Assessment & Plan Note (Signed)
Patient takes asa, Plavix and metformin  Screening for anemia and b12 deficiency has not been done in over 3 years.

## 2017-08-21 NOTE — Assessment & Plan Note (Signed)
I have addressed  BMI and recommended wt loss of 10% of body weight over the next 6 months using a low glycemic index diet and regular exercise a minimum of 5 days per week.   

## 2017-08-21 NOTE — Assessment & Plan Note (Signed)
Secondary to high salt diet.  Recommended reducing salt in diet and using furosemide prn (not more than 2/week)

## 2017-08-21 NOTE — Assessment & Plan Note (Signed)
Secondary to low dose metoprolol  . He denies dizzine and notes an appropriate increase in pulse when he walks or exercises.

## 2017-08-21 NOTE — Assessment & Plan Note (Signed)
Historically well-controlled on current medications, with recent onset of hypoglycemia caused by glipizide .  Discussed alternative therapy with Jardiance, including the beneficial effects on CAD.  Printed information given  .  No medication changes pending review of today's labs.  Patient has history of  minimal  microalbuminuria. Patient is tolerating statin therapy for CAD risk reduction and on ACE/ARB for renal protection and hypertension .  Lab Results  Component Value Date   HGBA1C 6.4 02/21/2017   Lab Results  Component Value Date   MICROALBUR 1.5 08/11/2016

## 2017-08-23 DIAGNOSIS — E538 Deficiency of other specified B group vitamins: Secondary | ICD-10-CM | POA: Insufficient documentation

## 2017-08-23 NOTE — Addendum Note (Signed)
Addended by: Crecencio Mc on: 08/23/2017 12:52 PM   Modules accepted: Orders

## 2017-08-23 NOTE — Assessment & Plan Note (Signed)
Likely due to metformin,   But will check rbc folate and intrinsic factor ab test with first RN visit for B12 injections.

## 2017-09-10 ENCOUNTER — Other Ambulatory Visit: Payer: Self-pay | Admitting: Internal Medicine

## 2017-09-18 ENCOUNTER — Other Ambulatory Visit: Payer: Self-pay | Admitting: Internal Medicine

## 2017-11-10 ENCOUNTER — Other Ambulatory Visit: Payer: Self-pay | Admitting: Internal Medicine

## 2017-11-22 ENCOUNTER — Other Ambulatory Visit: Payer: Self-pay | Admitting: Internal Medicine

## 2017-12-04 ENCOUNTER — Other Ambulatory Visit: Payer: Self-pay | Admitting: Internal Medicine

## 2018-01-28 ENCOUNTER — Other Ambulatory Visit: Payer: Self-pay | Admitting: Internal Medicine

## 2018-02-21 ENCOUNTER — Encounter: Payer: Self-pay | Admitting: Internal Medicine

## 2018-02-21 ENCOUNTER — Ambulatory Visit: Payer: Managed Care, Other (non HMO) | Admitting: Internal Medicine

## 2018-02-21 VITALS — BP 122/84 | HR 50 | Temp 98.7°F | Resp 15 | Ht 71.0 in | Wt 246.2 lb

## 2018-02-21 DIAGNOSIS — E785 Hyperlipidemia, unspecified: Secondary | ICD-10-CM

## 2018-02-21 DIAGNOSIS — Z6834 Body mass index (BMI) 34.0-34.9, adult: Secondary | ICD-10-CM

## 2018-02-21 DIAGNOSIS — Z23 Encounter for immunization: Secondary | ICD-10-CM

## 2018-02-21 DIAGNOSIS — R809 Proteinuria, unspecified: Secondary | ICD-10-CM | POA: Diagnosis not present

## 2018-02-21 DIAGNOSIS — E538 Deficiency of other specified B group vitamins: Secondary | ICD-10-CM

## 2018-02-21 DIAGNOSIS — E1129 Type 2 diabetes mellitus with other diabetic kidney complication: Secondary | ICD-10-CM

## 2018-02-21 DIAGNOSIS — Z800 Family history of malignant neoplasm of digestive organs: Secondary | ICD-10-CM

## 2018-02-21 DIAGNOSIS — Z125 Encounter for screening for malignant neoplasm of prostate: Secondary | ICD-10-CM | POA: Diagnosis not present

## 2018-02-21 DIAGNOSIS — Z0000 Encounter for general adult medical examination without abnormal findings: Secondary | ICD-10-CM | POA: Diagnosis not present

## 2018-02-21 DIAGNOSIS — E6609 Other obesity due to excess calories: Secondary | ICD-10-CM

## 2018-02-21 LAB — COMPREHENSIVE METABOLIC PANEL
ALT: 29 U/L (ref 0–53)
AST: 15 U/L (ref 0–37)
Albumin: 4.2 g/dL (ref 3.5–5.2)
Alkaline Phosphatase: 68 U/L (ref 39–117)
BUN: 29 mg/dL — ABNORMAL HIGH (ref 6–23)
CALCIUM: 9.8 mg/dL (ref 8.4–10.5)
CHLORIDE: 101 meq/L (ref 96–112)
CO2: 29 meq/L (ref 19–32)
CREATININE: 1.03 mg/dL (ref 0.40–1.50)
GFR: 78.82 mL/min (ref >60.00)
Glucose, Bld: 178 mg/dL — ABNORMAL HIGH (ref 70–99)
Potassium: 4.2 mEq/L (ref 3.5–5.1)
Sodium: 141 mEq/L (ref 135–145)
Total Bilirubin: 0.7 mg/dL (ref 0.2–1.2)
Total Protein: 6.7 g/dL (ref 6.0–8.3)

## 2018-02-21 LAB — LIPID PANEL
CHOL/HDL RATIO: 3
Cholesterol: 143 mg/dL (ref 0–200)
HDL: 48.1 mg/dL (ref >39.00)
LDL CALC: 57 mg/dL (ref 0–99)
NonHDL: 95.08
TRIGLYCERIDES: 188 mg/dL — AB (ref 0.0–149.0)
VLDL: 37.6 mg/dL (ref 0.0–40.0)

## 2018-02-21 LAB — VITAMIN B12: Vitamin B-12: 1435 pg/mL — ABNORMAL HIGH (ref 211–911)

## 2018-02-21 LAB — HEMOGLOBIN A1C: Hgb A1c MFr Bld: 7 % — ABNORMAL HIGH (ref 4.6–6.5)

## 2018-02-21 LAB — PSA: PSA: 0.47 ng/mL (ref 0.10–4.00)

## 2018-02-21 NOTE — Progress Notes (Signed)
Patient ID: Todd Jenkins, male    DOB: 1960/07/15  Age: 57 y.o. MRN: [id number]  The patient is here for annual preventive examination and management of other chronic and acute problems.   The risk factors are reflected in the social history.  The roster of all physicians providing medical care to patient - is listed in the Snapshot section of the chart.  Activities of daily living:  The patient is 100% independent in all ADLs: dressing, toileting, feeding as well as independent mobility  Home safety : The patient has smoke detectors in the home. They wear seatbelts.  There are no firearms at home. There is no violence in the home.   There is no risks for hepatitis, STDs or HIV. There is no   history of blood transfusion. They have no travel history to infectious disease endemic areas of the world.  The patient has seen their dentist in the last six month. They have seen their eye doctor in the last year. They deny any hearing difficulty with regard to whispered voices and some television programs.  They have deferred audiologic testing in the last year.  They do not  have excessive sun exposure. Discussed the need for sun protection: hats, long sleeves and use of sunscreen if there is significant sun exposure.   Diet: the importance of a healthy diet is discussed. They do have a healthy diet.  The benefits of regular aerobic exercise were discussed. he is not exercising regularly except golfing . 2 times per week   Depression screen: there are no signs or vegative symptoms of depression- irritability, change in appetite, anhedonia, sadness/tearfullness.   The following portions of the patient's history were reviewed and updated as appropriate: allergies, current medications, past family history, past medical history,  past surgical history, past social history  and problem list.  Visual acuity was not assessed per patient preference since she has regular follow up with her  ophthalmologist. Hearing and body mass index were assessed and reviewed.   During the course of the visit the patient was educated and counseled about appropriate screening and preventive services including : fall prevention , diabetes screening, nutrition counseling, colorectal cancer screening, and recommended immunizations.    CC: The primary encounter diagnosis was Family history of colon cancer requiring screening colonoscopy. Diagnoses of Hyperlipidemia with target LDL less than 70, Prostate cancer screening, B12 deficiency, Controlled type 2 diabetes mellitus with microalbuminuria, without long-term current use of insulin ([place]), Need for influenza vaccination, Encounter for preventive health examination, and Class 1 obesity due to excess calories with serious comorbidity and body mass index (BMI) of 34.0 to 34.9 in adult were also pertinent to this visit.  b12 deficiency diagnosed last visit ,  Message sent via Mychart   Has not been taking b12  Injections  But taking 1000 mcg daily   And one Vitamin D daily   Some right shoulder pain ,  golf 6 times last week on vacation  \overdue for 5 yr follow up colonoscopy and for  His  annual eye exam .  6 month follow up on diabetes.  Patient has no complaints today.  Patient is not following a low glycemic index diet and taking all prescribed medications regularly without side effects.  Fasting sugars have been under less than 140 most of the time and post prandials have been under 160 except on rare occasions. 90 day average is 119 .  Patient is exercising about 3 times per week and intentionally  trying to lose weight .  Patient has not had an eye exam in the last 12 months and checks feet regularly for signs of infection.  Patient does not walk barefoot outside,  And denies an numbness tingling or burning in feet. Patient is up to date on all recommended vaccinations.     History Todd Jenkins has a past medical history of Coronary artery disease, Erectile  dysfunction, Hyperlipidemia, Hypertensive heart disease, Ischemic cardiomyopathy, Morbid obesity ([place]), and Type II diabetes mellitus ([place]).   He has a past surgical history that includes Cardiac catheterization (July 2012) and Gastrostomy w/ feeding tube.   His family history includes Cancer in his father and mother; Diabetes in his mother; Heart disease in his mother; Hypertension in his father.He reports that he has been smoking cigars. He has never used smokeless tobacco. He reports that he drinks alcohol. He reports that he does not use drugs.  Outpatient Medications Prior to Visit  Medication Sig Dispense Refill  . aspirin 81 MG tablet Take 81 mg by mouth daily.    Marland Kitchen atorvastatin (LIPITOR) 40 MG tablet Take 1 tablet (40 mg total) by mouth daily. 90 tablet 1  . clopidogrel (PLAVIX) 75 MG tablet Take 1 tablet (75 mg total) by mouth daily. 90 tablet 1  . enalapril-hydrochlorothiazide (VASERETIC) 10-25 MG tablet TAKE 1 TABLET BY MOUTH DAILY. 90 tablet 1  . furosemide (LASIX) 20 MG tablet TAKE 1 TABLET (20 MG TOTAL) BY MOUTH DAILY. AS NEEDED FOR FLUID RETENTION 90 tablet 1  . glipiZIDE (GLUCOTROL) 10 MG tablet TAKE 1 TABLET (10 MG TOTAL) BY MOUTH 2 (TWO) TIMES DAILY BEFORE A MEAL. 180 tablet 1  . metoprolol succinate (TOPROL-XL) 25 MG 24 hr tablet TAKE 1/2 TABLET BY MOUTH DAILY. 90 tablet 1  . nystatin-triamcinolone ointment (MYCOLOG) Apply 1 application topically 2 (two) times daily. 30 g 0  . ONETOUCH VERIO test strip USE AS INSTRUCTED 100 each 2  . sildenafil (VIAGRA) 100 MG tablet Take 0.5-1 tablets (50-100 mg total) by mouth daily as needed for erectile dysfunction. 5 tablet 11  . triamcinolone cream (KENALOG) 0.1 % Apply 1 application topically 2 (two) times daily. As needed 30 g 0  . metFORMIN (GLUCOPHAGE) 850 MG tablet TAKE 1 TABLET (850 MG TOTAL) BY MOUTH 2 (TWO) TIMES DAILY WITH A MEAL.(NEED APPT FOR FURTHER REFILLS 180 tablet 1   No facility-administered medications prior to visit.      Review of Systems   Patient denies headache, fevers, malaise, unintentional weight loss, skin rash, eye pain, sinus congestion and sinus pain, sore throat, dysphagia,  hemoptysis , cough, dyspnea, wheezing, chest pain, palpitations, orthopnea, edema, abdominal pain, nausea, melena, diarrhea, constipation, flank pain, dysuria, hematuria, urinary  Frequency, nocturia, numbness, tingling, seizures,  Focal weakness, Loss of consciousness,  Tremor, insomnia, depression, anxiety, and suicidal ideation.      Objective:  BP 122/84 (BP Location: Left Arm, Patient Position: Sitting, Cuff Size: Normal)   Pulse (!) 50   Temp 98.7 F (37.1 C) (Oral)   Resp 15   Ht 5\' 11"  (1.803 m)   Wt 246 lb 3.2 oz (111.7 kg)   SpO2 97%   BMI 34.34 kg/m   Physical Exam   General appearance: alert, cooperative and appears stated age Ears: normal TM's and external ear canals both ears Throat: lips, mucosa, and tongue normal; teeth and gums normal Neck: no adenopathy, no carotid bruit, supple, symmetrical, trachea midline and thyroid not enlarged, symmetric, no tenderness/mass/nodules Back: symmetric, no curvature. ROM  normal. No CVA tenderness. Lungs: clear to auscultation bilaterally Heart: regular rate and rhythm, S1, S2 normal, no murmur, click, rub or gallop Abdomen: soft, non-tender; bowel sounds normal; no masses,  no organomegaly Pulses: 2+ and symmetric Skin: Skin color, texture, turgor normal. No rashes or lesions Lymph nodes: Cervical, supraclavicular, and axillary nodes normal.    Assessment & Plan:   Problem List Items Addressed This Visit    B12 deficiency    Responding to oral supplements.  Intrinsic factor and MMA pending   Lab Results  Component Value Date   VITAMINB12 1,435 (H) 02/21/2018         Relevant Orders   Intrinsic Factor Antibodies   RBC Folate (Completed)   Vitamin B12 (Completed)   Methylmalonic acid(mma), rnd urine   DM (diabetes mellitus) type II controlled  with renal manifestation ([place])    Well-controlled on current medications,   Patient has history of  minimal  microalbuminuria. Patient is tolerating statin therapy for CAD risk reduction and on ACE/ARB for renal protection and hypertension .  Lab Results  Component Value Date   HGBA1C 7.0 (H) 02/21/2018   Lab Results  Component Value Date   MICROALBUR 0.6 02/21/2018           Relevant Orders   Hemoglobin A1c (Completed)   Comprehensive metabolic panel (Completed)   Encounter for preventive health examination    Annual comprehensive preventive exam was done as well as an evaluation and management of acute and chronic conditions .  During the course of the visit the patient was educated and counseled about appropriate screening and preventive services including :  diabetes screening, lipid analysis with projected  10 year  risk for CAD , nutrition counseling, prostate and colorectal cancer screening, and recommended immunizations.  Printed recommendations for health maintenance screenings was given.   Lab Results  Component Value Date   PSA 0.47 02/21/2018   PSA 0.51 02/21/2017   PSA 0.40 04/29/2015         Hyperlipidemia with target LDL less than 52    Well controlled on current statin therapy.   Liver enzymes are normal , no changes today.  Lab Results  Component Value Date   CHOL 143 02/21/2018   HDL 48.10 02/21/2018   LDLCALC 57 02/21/2018   LDLDIRECT 54.0 02/21/2017   TRIG 188.0 (H) 02/21/2018   CHOLHDL 3 02/21/2018   Lab Results  Component Value Date   ALT 29 02/21/2018   AST 15 02/21/2018   ALKPHOS 68 02/21/2018   BILITOT 0.7 02/21/2018                 Relevant Orders   Lipid panel (Completed)   Obesity with serious comorbidity    With diabetes, hypertension and CAD.  I have addressed  BMI and recommended wt loss of 10% of body weigh over the next 6 months using a low glycemic index diet and regular exercise a minimum of 5 days per week.          Other Visit Diagnoses    Family history of colon cancer requiring screening colonoscopy    -  Primary   Relevant Orders   Ambulatory referral to General Surgery   Prostate cancer screening       Relevant Orders   PSA (Completed)   Urine Microalbumin w/creat. ratio (Completed)   Need for influenza vaccination       Relevant Orders   Flu Vaccine QUAD 6+ mos PF IM (Fluarix Quad PF) (  Completed)      I am having Loyal Gambler. Forgione maintain his aspirin, sildenafil, nystatin-triamcinolone ointment, triamcinolone cream, metoprolol succinate, atorvastatin, clopidogrel, enalapril-hydrochlorothiazide, furosemide, glipiZIDE, and ONETOUCH VERIO.  No orders of the defined types were placed in this encounter.   There are no discontinued medications.  Follow-up: Return in about 6 months (around 08/22/2018) for follow up diabetes.   Crecencio Mc, MD

## 2018-02-21 NOTE — Patient Instructions (Signed)
You are overdue for you colonoscopy, so I have placed the referral   You are overdue for an annual eye exam with [place] [facility], Male A healthy lifestyle and preventive care is important for your health and wellness. Ask your health care provider about what schedule of regular examinations is right for you. What should I know about weight and diet? Eat a Healthy Diet  Eat plenty of vegetables, fruits, whole grains, low-fat dairy products, and lean protein.  Do not eat a lot of foods high in solid fats, added sugars, or salt.  Maintain a Healthy Weight Regular exercise can help you achieve or maintain a healthy weight. You should:  Do at least 150 minutes of exercise each week. The exercise should increase your heart rate and make you sweat (moderate-intensity exercise).  Do strength-training exercises at least twice a week.  Watch Your Levels of Cholesterol and Blood Lipids  Have your blood tested for lipids and cholesterol every 5 years starting at 58 years of age. If you are at high risk for heart disease, you should start having your blood tested when you are 58 years old. You may need to have your cholesterol levels checked more often if: ? Your lipid or cholesterol levels are high. ? You are older than 58 years of age. ? You are at high risk for heart disease.  What should I know about cancer screening? Many types of cancers can be detected early and may often be prevented. Lung Cancer  You should be screened every year for lung cancer if: ? You are a current smoker who has smoked for at least 30 years. ? You are a former smoker who has quit within the past 15 years.  Talk to your health care provider about your screening options, when you should start screening, and how often you should be screened.  Colorectal Cancer  Routine colorectal cancer screening usually begins at 58 years of age and should be repeated every 5-10 years until you are 58 years  old. You may need to be screened more often if early forms of precancerous polyps or small growths are found. Your health care provider may recommend screening at an earlier age if you have risk factors for colon cancer.  Your health care provider may recommend using home test kits to check for hidden blood in the stool.  A small camera at the end of a tube can be used to examine your colon (sigmoidoscopy or colonoscopy). This checks for the earliest forms of colorectal cancer.  Prostate and Testicular Cancer  Depending on your age and overall health, your health care provider may do certain tests to screen for prostate and testicular cancer.  Talk to your health care provider about any symptoms or concerns you have about testicular or prostate cancer.  Skin Cancer  Check your skin from head to toe regularly.  Tell your health care provider about any new moles or changes in moles, especially if: ? There is a change in a mole's size, shape, or color. ? You have a mole that is larger than a pencil eraser.  Always use sunscreen. Apply sunscreen liberally and repeat throughout the day.  Protect yourself by wearing long sleeves, pants, a wide-brimmed hat, and sunglasses when outside.  What should I know about heart disease, diabetes, and high blood pressure?  If you are 40-4 years of age, have your blood pressure checked every 3-5 years. If you are 78 years of age or  older, have your blood pressure checked every year. You should have your blood pressure measured twice-once when you are at a hospital or clinic, and once when you are not at a hospital or clinic. Record the average of the two measurements. To check your blood pressure when you are not at a hospital or clinic, you can use: ? An automated blood pressure machine at a pharmacy. ? A home blood pressure monitor.  Talk to your health care provider about your target blood pressure.  If you are between 35-40 years old, ask your  health care provider if you should take aspirin to prevent heart disease.  Have regular diabetes screenings by checking your fasting blood sugar level. ? If you are at a normal weight and have a low risk for diabetes, have this test once every three years after the age of 34. ? If you are overweight and have a high risk for diabetes, consider being tested at a younger age or more often.  A one-time screening for abdominal aortic aneurysm (AAA) by ultrasound is recommended for men aged 56-75 years who are current or former smokers. What should I know about preventing infection? Hepatitis B If you have a higher risk for hepatitis B, you should be screened for this virus. Talk with your health care provider to find out if you are at risk for hepatitis B infection. Hepatitis C Blood testing is recommended for:  Everyone born from [birth year] through 1966.  Anyone with known risk factors for hepatitis C.  Sexually Transmitted Diseases (STDs)  You should be screened each year for STDs including gonorrhea and chlamydia if: ? You are sexually active and are younger than 58 years of age. ? You are older than 58 years of age and your health care provider tells you that you are at risk for this type of infection. ? Your sexual activity has changed since you were last screened and you are at an increased risk for chlamydia or gonorrhea. Ask your health care provider if you are at risk.  Talk with your health care provider about whether you are at high risk of being infected with HIV. Your health care provider may recommend a prescription medicine to help prevent HIV infection.  What else can I do?  Schedule regular health, dental, and eye exams.  Stay current with your vaccines (immunizations).  Do not use any tobacco products, such as cigarettes, chewing tobacco, and e-cigarettes. If you need help quitting, ask your health care provider.  Limit alcohol intake to no more than 2 drinks per day. One  drink equals 12 ounces of beer, 5 ounces of wine, or 1 ounces of hard liquor.  Do not use street drugs.  Do not share needles.  Ask your health care provider for help if you need support or information about quitting drugs.  Tell your health care provider if you often feel depressed.  Tell your health care provider if you have ever been abused or do not feel safe at home. This information is not intended to replace advice given to you by your health care provider. Make sure you discuss any questions you have with your health care provider. Document Released: 11/12/2007 Document Revised: 01/13/2016 Document Reviewed: 02/17/2015 Elsevier Interactive Patient Education  [employer].

## 2018-02-22 ENCOUNTER — Other Ambulatory Visit: Payer: Self-pay | Admitting: Internal Medicine

## 2018-02-22 LAB — MICROALBUMIN / CREATININE URINE RATIO
CREATININE, URINE: 91 mg/dL (ref 20–320)
Microalb Creat Ratio: 7 mcg/mg creat (ref <30)
Microalb, Ur: 0.6 mg/dL

## 2018-02-22 NOTE — Assessment & Plan Note (Addendum)
With diabetes, hypertension and CAD.  I have addressed  BMI and recommended wt loss of 10% of body weigh over the next 6 months using a low glycemic index diet and regular exercise a minimum of 5 days per week.

## 2018-02-22 NOTE — Assessment & Plan Note (Signed)
Well controlled on current statin therapy.   Liver enzymes are normal , no changes today.  Lab Results  Component Value Date   CHOL 143 02/21/2018   HDL 48.10 02/21/2018   LDLCALC 57 02/21/2018   LDLDIRECT 54.0 02/21/2017   TRIG 188.0 (H) 02/21/2018   CHOLHDL 3 02/21/2018   Lab Results  Component Value Date   ALT 29 02/21/2018   AST 15 02/21/2018   ALKPHOS 68 02/21/2018   BILITOT 0.7 02/21/2018

## 2018-02-22 NOTE — Assessment & Plan Note (Signed)
Annual comprehensive preventive exam was done as well as an evaluation and management of acute and chronic conditions .  During the course of the visit the patient was educated and counseled about appropriate screening and preventive services including :  diabetes screening, lipid analysis with projected  10 year  risk for CAD , nutrition counseling, prostate and colorectal cancer screening, and recommended immunizations.  Printed recommendations for health maintenance screenings was given.   Lab Results  Component Value Date   PSA 0.47 02/21/2018   PSA 0.51 02/21/2017   PSA 0.40 04/29/2015

## 2018-02-22 NOTE — Assessment & Plan Note (Signed)
Well-controlled on current medications,   Patient has history of  minimal  microalbuminuria. Patient is tolerating statin therapy for CAD risk reduction and on ACE/ARB for renal protection and hypertension .  Lab Results  Component Value Date   HGBA1C 7.0 (H) 02/21/2018   Lab Results  Component Value Date   MICROALBUR 0.6 02/21/2018

## 2018-02-22 NOTE — Assessment & Plan Note (Signed)
Responding to oral supplements.  Intrinsic factor and MMA pending   Lab Results  Component Value Date   VITAMINB12 1,435 (H) 02/21/2018

## 2018-02-23 LAB — FOLATE RBC: RBC FOLATE: 680 ng/mL (ref >280)

## 2018-02-23 LAB — INTRINSIC FACTOR ANTIBODIES: Intrinsic Factor: NEGATIVE

## 2018-02-27 ENCOUNTER — Encounter: Payer: Self-pay | Admitting: General Surgery

## 2018-02-28 LAB — METHYLMALONIC ACID(MMA), RND URINE
Creatinine: 7.94 mmol/L (ref 1.77–23.31)
METHYLMALONIC ACID RANDOM UR MMOL/L: 0.6 mmol/mol{creat} (ref 0.3–2.2)

## 2018-03-24 ENCOUNTER — Other Ambulatory Visit: Payer: Self-pay | Admitting: Internal Medicine

## 2018-05-06 ENCOUNTER — Other Ambulatory Visit: Payer: Self-pay | Admitting: Internal Medicine

## 2018-05-20 ENCOUNTER — Other Ambulatory Visit: Payer: Self-pay | Admitting: Internal Medicine

## 2018-08-22 ENCOUNTER — Ambulatory Visit: Payer: Managed Care, Other (non HMO) | Admitting: Internal Medicine

## 2018-09-12 ENCOUNTER — Other Ambulatory Visit: Payer: Self-pay | Admitting: Internal Medicine

## 2018-09-18 ENCOUNTER — Other Ambulatory Visit: Payer: Self-pay | Admitting: Internal Medicine

## 2018-09-28 ENCOUNTER — Other Ambulatory Visit: Payer: Self-pay | Admitting: Internal Medicine

## 2018-10-08 ENCOUNTER — Ambulatory Visit: Payer: Managed Care, Other (non HMO) | Admitting: Internal Medicine

## 2018-10-12 ENCOUNTER — Ambulatory Visit (INDEPENDENT_AMBULATORY_CARE_PROVIDER_SITE_OTHER): Payer: Managed Care, Other (non HMO) | Admitting: Internal Medicine

## 2018-10-12 DIAGNOSIS — M65349 Trigger finger, unspecified ring finger: Secondary | ICD-10-CM

## 2018-10-12 DIAGNOSIS — Z7189 Other specified counseling: Secondary | ICD-10-CM | POA: Diagnosis not present

## 2018-10-12 DIAGNOSIS — I119 Hypertensive heart disease without heart failure: Secondary | ICD-10-CM

## 2018-10-12 DIAGNOSIS — E785 Hyperlipidemia, unspecified: Secondary | ICD-10-CM

## 2018-10-12 DIAGNOSIS — R809 Proteinuria, unspecified: Secondary | ICD-10-CM

## 2018-10-12 DIAGNOSIS — I255 Ischemic cardiomyopathy: Secondary | ICD-10-CM

## 2018-10-12 DIAGNOSIS — E1129 Type 2 diabetes mellitus with other diabetic kidney complication: Secondary | ICD-10-CM | POA: Diagnosis not present

## 2018-10-12 MED ORDER — TELMISARTAN-HCTZ 40-12.5 MG PO TABS: 1.0000 | ORAL_TABLET | Freq: Every day | ORAL | 1 refills | Status: DC

## 2018-10-12 NOTE — Patient Instructions (Signed)
Glad you are doing well .  Here's what we discussed today:    I am making a decision to change your  Enalapril  to telmisartan, based on increased reports by one of my ENT colleagues of patients  developing tongue and throat swelling from lisinopril.  The condition , called "angioedema," can be fatal if a person's airway is compromised.  I also want you to take it at night instead of morning,  s recent studies have shown that taking your blood pressure medications at night protects you better from heart attacks and strokes.    You are due for fasting labs.  Please make a fasting lab appt at your convenience  Your hand issues sound like you are developing a "trigger finger"   On each ring finger extensor tendon.  Try icing the area for 15 minutes  A few times daily after using your hands to hep shrink the swelling

## 2018-10-12 NOTE — Assessment & Plan Note (Signed)
Educated patient on the signs and symptoms of COVID-19 infection and ways to avoid the viral infection including washing hands frequently with soap and water,  using hand sanitizer if unable to wash, avoiding touching face,  staying at home and limiting visitors,  and avoiding contact with people coming in and out of home.  Reminded patient to call office with questions/concerns.  The importance of social distancing was discussed today 

## 2018-10-12 NOTE — Progress Notes (Signed)
Telephone  Note  This visit type was conducted due to national recommendations for restrictions regarding the COVID-19 pandemic (e.g. social distancing).  This format is felt to be most appropriate for this patient at this time.  All issues noted in this document were discussed and addressed.  No physical exam was performed (except for noted visual exam findings with Video Visits).   I was unable to connect with@ on 10/12/18 at  2:00 PM EDT by a video enabled telemedicine application despite multiple attempts; thus  the visit was converted to a telephone visit .  I verified that I am speaking with the correct person using two identifiers. Location patient: home Location provider:  home office Persons participating in the virtual visit: patient, provider  I discussed the limitations, risks, security and privacy concerns of performing an evaluation and management service by telephone and the availability of in person appointments. I also discussed with the patient that there may be a patient responsible charge related to this service. The patient expressed understanding and agreed to proceed.  Again,  Interactive audio and video telecommunications were attempted between this provider and patient, however failed, due to patient having technical difficulties   We continued and completed visit with audio only.   Reason for visit: follow up on type 2 DM, hyperlipidemia, CAD and obesity.    HPI:  6 month follow up on diabetes.  Patient has no complaints today.  Patient is following a low glycemic index diet 75% of the time and taking all prescribed medications regularly without side effects.  Fasting sugars have been under 110 most of the time and post prandials have been under 160 except on rare occasions. Patient is exercising about 3 times per week and has lost over ten lbs since  his last visit because he is eating at home due to the [place] 19 pandemic. Marland Kitchen  Patient has had a postponement of his annual  eye exam .   checks feet regularly for signs of infection.  Patient does not walk barefoot outside,  And denies an numbness tingling or burning in feet. Patient is up to date on all recommended vaccinations.  The patient has no signs or symptoms of COVID 19 infection (fever, cough, sore throat  or shortness of breath beyond what is typical for patient).  Patient denies contact with other persons with the above mentioned symptoms or with anyone confirmed to have COVID 19   Hypertension: patient checks blood pressure twice weekly at home.  Readings have been for the most part > 140/80 at rest . Patient is following a reduce salt diet most days and is taking medications as prescribed.  Discusses the safety concerns with enalapril use (angioedema) and the rationale for change.  Bilateral palmar nodules on the flexor tendon of each ring finger.  Denies pain and inability to extend fingers.   ROS: See pertinent positives and negatives per HPI.  Past Medical History:  Diagnosis Date  . Coronary artery disease    a. 11/2010 NSTEMI s/p PCI/DES to 99 % LAD (3.0x18 Xience).  . Erectile dysfunction   . Hyperlipidemia   . Hypertensive heart disease   . Ischemic cardiomyopathy    a. 11/2010 LV gram: EF 30-35%; b. 11/2010 Echo: EF 40-45%, impaired LV relaxation, mod-sev Apical AK, nl RV fxn.  . Morbid obesity ([place])   . Type II diabetes mellitus ([place])     Past Surgical History:  Procedure Laterality Date  . CARDIAC CATHETERIZATION  July 2012  .  GASTROSTOMY W/ FEEDING TUBE     at birth.,  premature 1 of 3 triplets     Family History  Problem Relation Age of Onset  . Cancer Mother        liver  . Diabetes Mother   . Heart disease Mother   . Cancer Father        colon  . Hypertension Father     SOCIAL HX: married,  Employed,  Moderate alcohol,  No tobacco    Current Outpatient Medications:  .  aspirin 81 MG tablet, Take 81 mg by mouth daily., Disp: , Rfl:  .  atorvastatin (LIPITOR) 40 MG  tablet, TAKE 1 TABLET BY MOUTH EVERY DAY, Disp: 90 tablet, Rfl: 1 .  clopidogrel (PLAVIX) 75 MG tablet, TAKE 1 TABLET BY MOUTH EVERY DAY, Disp: 90 tablet, Rfl: 1 .  furosemide (LASIX) 20 MG tablet, TAKE 1 TABLET (20 MG TOTAL) BY MOUTH DAILY. AS NEEDED FOR FLUID RETENTION, Disp: 90 tablet, Rfl: 1 .  glipiZIDE (GLUCOTROL) 10 MG tablet, TAKE 1 TABLET (10 MG TOTAL) BY MOUTH 2 (TWO) TIMES DAILY BEFORE A MEAL., Disp: 180 tablet, Rfl: 1 .  metFORMIN (GLUCOPHAGE) 850 MG tablet, TAKE 1 TABLET (850 MG TOTAL) BY MOUTH 2 (TWO) TIMES DAILY WITH A MEAL.(NEED APPT FOR FURTHER REFILLS, Disp: 180 tablet, Rfl: 1 .  metoprolol succinate (TOPROL-XL) 25 MG 24 hr tablet, TAKE 1/2 TABLET BY MOUTH DAILY., Disp: 45 tablet, Rfl: 3 .  nystatin-triamcinolone ointment (MYCOLOG), Apply 1 application topically 2 (two) times daily., Disp: 30 g, Rfl: 0 .  ONETOUCH VERIO test strip, USE AS INSTRUCTED, Disp: 10 each, Rfl: 2 .  ONETOUCH VERIO test strip, USE AS INSTRUCTED, Disp: 100 each, Rfl: 2 .  sildenafil (VIAGRA) 100 MG tablet, Take 0.5-1 tablets (50-100 mg total) by mouth daily as needed for erectile dysfunction., Disp: 5 tablet, Rfl: 11 .  triamcinolone cream (KENALOG) 0.1 %, Apply 1 application topically 2 (two) times daily. As needed, Disp: 30 g, Rfl: 0 .  telmisartan-hydrochlorothiazide (MICARDIS HCT) 40-12.5 MG tablet, Take 1 tablet by mouth at bedtime., Disp: 90 tablet, Rfl: 1  EXAM:  General impression: alert, cooperative and articulate.  No signs of being in distress  Lungs: speech is fluent sentence length suggests that patient is not short of breath and not punctuated by cough, sneezing or sniffing. Marland Kitchen   Psych: affect normal.  speech is articulate and non pressured .  Denies suicidal thoughts   ASSESSMENT AND PLAN:  Discussed the following assessment and plan:  Controlled type 2 diabetes mellitus with microalbuminuria, without long-term current use of insulin (HCC) - Plan: Hemoglobin A1c, Comprehensive metabolic  panel  Educated About Covid-19 Virus Infection  Hyperlipidemia with target LDL less than 70 - Plan: Lipid panel  Trigger ring finger, unspecified laterality  Ischemic cardiomyopathy  Hypertensive heart disease without heart failure  Educated About Covid-19 Virus Infection Educated patient on the signs and symptoms of COVID-19 infection and ways to avoid the viral infection including washing hands frequently with soap and water,  using hand sanitizer if unable to wash, avoiding touching face,  staying at home and limiting visitors,  and avoiding contact with people coming in and out of home.  Reminded patient to call office with questions/concerns.  The importance of social distancing was discussed today  Trigger ring finger Bilateral,  Without pain or resisted extension.  Secondary to overuse, at increased risk secondary to diabetes mellitus . supportive care outlined.   Ischemic cardiomyopathy He has been  asymptomatic, is taking his medications including daily aspirin, ACE inhibitor, beta blocker and statin.  Continue semi annual follow up with Dr. Rockey Situ.  Lab Results  Component Value Date   CHOL 143 02/21/2018   HDL 48.10 02/21/2018   LDLCALC 57 02/21/2018   LDLDIRECT 54.0 02/21/2017   TRIG 188.0 (H) 02/21/2018   CHOLHDL 3 02/21/2018          Hyperlipidemia with target LDL less than 18 Historically Well controlled on current statin therapy.   Liver enzymes are due , no changes today.  Lab Results  Component Value Date   CHOL 143 02/21/2018   HDL 48.10 02/21/2018   LDLCALC 57 02/21/2018   LDLDIRECT 54.0 02/21/2017   TRIG 188.0 (H) 02/21/2018   CHOLHDL 3 02/21/2018   Lab Results  Component Value Date   ALT 29 02/21/2018   AST 15 02/21/2018   ALKPHOS 68 02/21/2018   BILITOT 0.7 02/21/2018             Hypertensive heart disease Well controlled on current regimen. Renal function stable. I am making a decision to change patient's ACE Inhibitor to an ARB   based on increased reports of  angioedema.  I also advised patient to to take it at night instead of morning,  as recent studies have shown a reduction in incidence of heart attacks and strokes.    DM (diabetes mellitus) type II controlled with renal manifestation (HCC) Historically Well-controlled on current medications,   Patient has history of  minimal  microalbuminuria. Patient is tolerating statin therapy for CAD risk reduction and on ACE/ARB for renal protection and hypertension .  Lab Results  Component Value Date   HGBA1C 7.0 (H) 02/21/2018   Lab Results  Component Value Date   MICROALBUR 0.6 02/21/2018         I discussed the assessment and treatment plan with the patient. The patient was provided an opportunity to ask questions and all were answered. The patient agreed with the plan and demonstrated an understanding of the instructions.   The patient was advised to call back or seek an in-person evaluation if the symptoms worsen or if the condition fails to improve as anticipated.  I provided 25 minutes of non-face-to-face time during this encounter.   Crecencio Mc, MD

## 2018-10-14 DIAGNOSIS — M65349 Trigger finger, unspecified ring finger: Secondary | ICD-10-CM | POA: Insufficient documentation

## 2018-10-14 NOTE — Assessment & Plan Note (Signed)
Bilateral,  Without pain or resisted extension.  Secondary to overuse, at increased risk secondary to diabetes mellitus . supportive care outlined.

## 2018-10-14 NOTE — Assessment & Plan Note (Signed)
Historically Well controlled on current statin therapy.   Liver enzymes are due , no changes today.  Lab Results  Component Value Date   CHOL 143 02/21/2018   HDL 48.10 02/21/2018   LDLCALC 57 02/21/2018   LDLDIRECT 54.0 02/21/2017   TRIG 188.0 (H) 02/21/2018   CHOLHDL 3 02/21/2018   Lab Results  Component Value Date   ALT 29 02/21/2018   AST 15 02/21/2018   ALKPHOS 68 02/21/2018   BILITOT 0.7 02/21/2018

## 2018-10-14 NOTE — Assessment & Plan Note (Signed)
He has been asymptomatic, is taking his medications including daily aspirin, ACE inhibitor, beta blocker and statin.  Continue semi annual follow up with Dr. Rockey Situ.  Lab Results  Component Value Date   CHOL 143 02/21/2018   HDL 48.10 02/21/2018   LDLCALC 57 02/21/2018   LDLDIRECT 54.0 02/21/2017   TRIG 188.0 (H) 02/21/2018   CHOLHDL 3 02/21/2018

## 2018-10-14 NOTE — Assessment & Plan Note (Signed)
Well controlled on current regimen. Renal function stable. I am making a decision to change patient's ACE Inhibitor to an ARB  based on increased reports of  angioedema.  I also advised patient to to take it at night instead of morning,  as recent studies have shown a reduction in incidence of heart attacks and strokes.

## 2018-10-14 NOTE — Assessment & Plan Note (Signed)
Historically Well-controlled on current medications,   Patient has history of  minimal  microalbuminuria. Patient is tolerating statin therapy for CAD risk reduction and on ACE/ARB for renal protection and hypertension .  Lab Results  Component Value Date   HGBA1C 7.0 (H) 02/21/2018   Lab Results  Component Value Date   MICROALBUR 0.6 02/21/2018

## 2018-11-20 ENCOUNTER — Other Ambulatory Visit: Payer: Self-pay | Admitting: Internal Medicine

## 2019-01-27 ENCOUNTER — Other Ambulatory Visit: Payer: Self-pay | Admitting: Internal Medicine

## 2019-02-21 ENCOUNTER — Other Ambulatory Visit: Payer: Self-pay | Admitting: Internal Medicine

## 2019-03-28 ENCOUNTER — Other Ambulatory Visit: Payer: Self-pay | Admitting: Internal Medicine

## 2019-03-30 ENCOUNTER — Other Ambulatory Visit: Payer: Self-pay | Admitting: Internal Medicine

## 2019-07-04 ENCOUNTER — Other Ambulatory Visit: Payer: Self-pay | Admitting: Internal Medicine

## 2019-08-01 MED ORDER — TRIAMCINOLONE ACETONIDE 0.1 % EX CREA: 1.0000 "application " | TOPICAL_CREAM | Freq: Two times a day (BID) | CUTANEOUS | 0 refills | Status: AC

## 2019-08-31 ENCOUNTER — Other Ambulatory Visit: Payer: Self-pay | Admitting: Internal Medicine

## 2019-10-03 ENCOUNTER — Other Ambulatory Visit: Payer: Self-pay | Admitting: Internal Medicine

## 2019-12-22 ENCOUNTER — Other Ambulatory Visit: Payer: Self-pay | Admitting: Internal Medicine

## 2020-01-01 ENCOUNTER — Other Ambulatory Visit: Payer: Self-pay | Admitting: Internal Medicine

## 2020-03-01 LAB — HM DIABETES EYE EXAM

## 2020-03-08 ENCOUNTER — Other Ambulatory Visit: Payer: Self-pay | Admitting: Internal Medicine

## 2020-03-24 ENCOUNTER — Other Ambulatory Visit: Payer: Self-pay | Admitting: Internal Medicine

## 2020-06-17 ENCOUNTER — Encounter: Payer: Managed Care, Other (non HMO) | Admitting: Internal Medicine

## 2020-06-18 ENCOUNTER — Other Ambulatory Visit: Payer: Self-pay | Admitting: Internal Medicine

## 2020-07-01 ENCOUNTER — Other Ambulatory Visit: Payer: Self-pay

## 2020-07-01 MED ORDER — NYSTATIN-TRIAMCINOLONE 100000-0.1 UNIT/GM-% EX OINT: 1.0000 "application " | TOPICAL_OINTMENT | Freq: Two times a day (BID) | CUTANEOUS | 0 refills | Status: DC

## 2020-07-02 ENCOUNTER — Encounter: Payer: Self-pay | Admitting: Internal Medicine

## 2020-07-02 ENCOUNTER — Other Ambulatory Visit: Payer: Self-pay

## 2020-07-02 ENCOUNTER — Other Ambulatory Visit: Payer: Self-pay | Admitting: Internal Medicine

## 2020-07-02 ENCOUNTER — Ambulatory Visit (INDEPENDENT_AMBULATORY_CARE_PROVIDER_SITE_OTHER): Payer: Managed Care, Other (non HMO) | Admitting: Internal Medicine

## 2020-07-02 VITALS — BP 122/78 | HR 69 | Temp 98.5°F | Ht 70.98 in | Wt 251.2 lb

## 2020-07-02 DIAGNOSIS — E6609 Other obesity due to excess calories: Secondary | ICD-10-CM

## 2020-07-02 DIAGNOSIS — E785 Hyperlipidemia, unspecified: Secondary | ICD-10-CM | POA: Diagnosis not present

## 2020-07-02 DIAGNOSIS — Z1211 Encounter for screening for malignant neoplasm of colon: Secondary | ICD-10-CM | POA: Diagnosis not present

## 2020-07-02 DIAGNOSIS — I25118 Atherosclerotic heart disease of native coronary artery with other forms of angina pectoris: Secondary | ICD-10-CM | POA: Diagnosis not present

## 2020-07-02 DIAGNOSIS — Z125 Encounter for screening for malignant neoplasm of prostate: Secondary | ICD-10-CM | POA: Diagnosis not present

## 2020-07-02 DIAGNOSIS — E66811 Obesity, class 1: Secondary | ICD-10-CM

## 2020-07-02 DIAGNOSIS — Z0000 Encounter for general adult medical examination without abnormal findings: Secondary | ICD-10-CM

## 2020-07-02 DIAGNOSIS — I119 Hypertensive heart disease without heart failure: Secondary | ICD-10-CM

## 2020-07-02 DIAGNOSIS — N182 Chronic kidney disease, stage 2 (mild): Secondary | ICD-10-CM | POA: Diagnosis not present

## 2020-07-02 DIAGNOSIS — Z6834 Body mass index (BMI) 34.0-34.9, adult: Secondary | ICD-10-CM

## 2020-07-02 DIAGNOSIS — E1122 Type 2 diabetes mellitus with diabetic chronic kidney disease: Secondary | ICD-10-CM

## 2020-07-02 DIAGNOSIS — K644 Residual hemorrhoidal skin tags: Secondary | ICD-10-CM

## 2020-07-02 LAB — MICROALBUMIN / CREATININE URINE RATIO
Creatinine,U: 171 mg/dL
Microalb Creat Ratio: 3.5 mg/g (ref 0.0–30.0)
Microalb, Ur: 6 mg/dL — ABNORMAL HIGH (ref 0.0–1.9)

## 2020-07-02 LAB — COMPREHENSIVE METABOLIC PANEL
ALT: 27 U/L (ref 0–53)
AST: 15 U/L (ref 0–37)
Albumin: 4.3 g/dL (ref 3.5–5.2)
Alkaline Phosphatase: 69 U/L (ref 39–117)
BUN: 21 mg/dL (ref 6–23)
CO2: 27 mEq/L (ref 19–32)
Calcium: 9.6 mg/dL (ref 8.4–10.5)
Chloride: 103 mEq/L (ref 96–112)
Creatinine, Ser: 1.08 mg/dL (ref 0.40–1.50)
GFR: 74.63 mL/min (ref >60.00)
Glucose, Bld: 149 mg/dL — ABNORMAL HIGH (ref 70–99)
Potassium: 4.4 mEq/L (ref 3.5–5.1)
Sodium: 139 mEq/L (ref 135–145)
Total Bilirubin: 0.9 mg/dL (ref 0.2–1.2)
Total Protein: 6.8 g/dL (ref 6.0–8.3)

## 2020-07-02 LAB — LIPID PANEL
Cholesterol: 139 mg/dL (ref 0–200)
HDL: 46.6 mg/dL (ref >39.00)
LDL Cholesterol: 59 mg/dL (ref 0–99)
NonHDL: 92.6
Total CHOL/HDL Ratio: 3
Triglycerides: 169 mg/dL — ABNORMAL HIGH (ref 0.0–149.0)
VLDL: 33.8 mg/dL (ref 0.0–40.0)

## 2020-07-02 LAB — PSA: PSA: 0.47 ng/mL (ref 0.10–4.00)

## 2020-07-02 LAB — HEMOGLOBIN A1C: Hgb A1c MFr Bld: 6.7 % — ABNORMAL HIGH (ref 4.6–6.5)

## 2020-07-02 NOTE — Patient Instructions (Addendum)
I will initiate the order for your colon cancer screening  Test.  It is called  Cologuard.  It will be delivered to your house, and you will send off a stool sample in the envelope it provides.     For your hemorrhoid,  You can use  Proctofoam  Anusol HC   Hemorrhoids Hemorrhoids are swollen veins in and around the rectum or anus. There are two types of hemorrhoids:  Internal hemorrhoids. These occur in the veins that are just inside the rectum. They may poke through to the outside and become irritated and painful.  External hemorrhoids. These occur in the veins that are outside the anus and can be felt as a painful swelling or hard lump near the anus. Most hemorrhoids do not cause serious problems, and they can be managed with home treatments such as diet and lifestyle changes. If home treatments do not help the symptoms, procedures can be done to shrink or remove the hemorrhoids. What are the causes? This condition is caused by increased pressure in the anal area. This pressure may result from various things, including:  Constipation.  Straining to have a bowel movement.  Diarrhea.  Pregnancy.  Obesity.  Sitting for long periods of time.  Heavy lifting or other activity that causes you to strain.  Anal sex.  Riding a bike for a long period of time. What are the signs or symptoms? Symptoms of this condition include:  Pain.  Anal itching or irritation.  Rectal bleeding.  Leakage of stool (feces).  Anal swelling.  One or more lumps around the anus. How is this diagnosed? This condition can often be diagnosed through a visual exam. Other exams or tests may also be done, such as:  An exam that involves feeling the rectal area with a gloved hand (digital rectal exam).  An exam of the anal canal that is done using a small tube (anoscope).  A blood test, if you have lost a significant amount of blood.  A test to look inside the colon using a flexible tube with a  camera on the end (sigmoidoscopy or colonoscopy). How is this treated? This condition can usually be treated at home. However, various procedures may be done if dietary changes, lifestyle changes, and other home treatments do not help your symptoms. These procedures can help make the hemorrhoids smaller or remove them completely. Some of these procedures involve surgery, and others do not. Common procedures include:  Rubber band ligation. Rubber bands are placed at the base of the hemorrhoids to cut off their blood supply.  Sclerotherapy. Medicine is injected into the hemorrhoids to shrink them.  Infrared coagulation. A type of light energy is used to get rid of the hemorrhoids.  Hemorrhoidectomy surgery. The hemorrhoids are surgically removed, and the veins that supply them are tied off.  Stapled hemorrhoidopexy surgery. The surgeon staples the base of the hemorrhoid to the rectal wall. Follow these instructions at home: Eating and drinking  Eat foods that have a lot of fiber in them, such as whole grains, beans, nuts, fruits, and vegetables.  Ask your health care provider about taking products that have added fiber (fiber supplements).  Reduce the amount of fat in your diet. You can do this by eating low-fat dairy products, eating less red meat, and avoiding processed foods.  Drink enough fluid to keep your urine pale yellow.   Managing pain and swelling  Take warm sitz baths for 20 minutes, 3-4 times a day to ease pain  and discomfort. You may do this in a bathtub or using a portable sitz bath that fits over the toilet.  If directed, apply ice to the affected area. Using ice packs between sitz baths may be helpful. ? Put ice in a plastic bag. ? Place a towel between your skin and the bag. ? Leave the ice on for 20 minutes, 2-3 times a day.   General instructions  Take over-the-counter and prescription medicines only as told by your health care provider.  Use medicated creams or  suppositories as told.  Get regular exercise. Ask your health care provider how much and what kind of exercise is best for you. In general, you should do moderate exercise for at least 30 minutes on most days of the week (150 minutes each week). This can include activities such as walking, biking, or yoga.  Go to the bathroom when you have the urge to have a bowel movement. Do not wait.  Avoid straining to have bowel movements.  Keep the anal area dry and clean. Use wet toilet paper or moist towelettes after a bowel movement.  Do not sit on the toilet for long periods of time. This increases blood pooling and pain.  Keep all follow-up visits as told by your health care provider. This is important. Contact a health care provider if you have:  Increasing pain and swelling that are not controlled by treatment or medicine.  Difficulty having a bowel movement, or you are unable to have a bowel movement.  Pain or inflammation outside the area of the hemorrhoids. Get help right away if you have:  Uncontrolled bleeding from your rectum. Summary  Hemorrhoids are swollen veins in and around the rectum or anus.  Most hemorrhoids can be managed with home treatments such as diet and lifestyle changes.  Taking warm sitz baths can help ease pain and discomfort.  In severe cases, procedures or surgery can be done to shrink or remove the hemorrhoids. This information is not intended to replace advice given to you by your health care provider. Make sure you discuss any questions you have with your health care provider. Document Revised: 10/12/2018 Document Reviewed: 10/05/2017 Elsevier Patient Education  [place].

## 2020-07-02 NOTE — Progress Notes (Signed)
Patient ID: Todd Jenkins, male    DOB: March 05, 1961  Age: 60 y.o. MRN: [id number]  The patient is here for annual preventive  examination and management of other chronic and acute problems.  This visit occurred during the SARS-CoV-2 public health emergency.  Safety protocols were in place, including screening questions prior to the visit, additional usage of staff PPE, and extensive cleaning of exam room while observing appropriate contact time as indicated for disinfecting solutions.    The risk factors are reflected in the social history.  The roster of all physicians providing medical care to patient - is listed in the Snapshot section of the chart.  Diabetes Health Maintenance Due  Topic Date Due  . FOOT EXAM  02/22/2019  . HEMOGLOBIN A1C  12/30/2020  . OPHTHALMOLOGY EXAM  03/01/2021   HM Colonoscopy         COLONOSCOPY (Pts 45-60yrs Insurance coverage will need to be confirmed) (Every 10 Years) Tentatively due on 07/10/2020   07/10/2010  HM Colonoscopy component of HM COLONOSCOPY         Activities of daily living:  The patient is 100% independent in all ADLs: dressing, toileting, feeding as well as independent mobility  Home safety : The patient has smoke detectors in the home. They wear seatbelts.  There are no firearms at home. There is no violence in the home.   There is no risks for hepatitis, STDs or HIV. There is no   history of blood transfusion. They have no travel history to infectious disease endemic areas of the world.  The patient has seen their dentist in the last six month. They have seen their eye doctor in the last year. They admit to slight hearing difficulty with regard to whispered voices and some television programs.  They have deferred audiologic testing in the last year.  They do not  have excessive sun exposure. Discussed the need for sun protection: hats, long sleeves and use of sunscreen if there is significant sun exposure.   Diet: the importance of  a healthy diet is discussed. They do have a healthy diet.  The benefits of regular aerobic exercise were discussed. He has not been exercising regularly   Depression screen: there are no signs or vegative symptoms of depression- irritability, change in appetite, anhedonia, sadness/tearfullness.  Cognitive assessment: the patient manages all their financial and personal affairs and is actively engaged. They could relate day,date,year and events; recalled 2/3 objects at 3 minutes; performed clock-face test normally.  The following portions of the patient's history were reviewed and updated as appropriate: allergies, current medications, past family history, past medical history,  past surgical history, past social history  and problem list.  Visual acuity was not assessed per patient preference since she has regular follow up with her ophthalmologist. Hearing and body mass index were assessed and reviewed.   During the course of the visit the patient was educated and counseled about appropriate screening and preventive services including : fall prevention , diabetes screening, nutrition counseling, colorectal cancer screening, and recommended immunizations.    CC: The primary encounter diagnosis was Encounter for preventive health examination. Diagnoses of Prostate cancer screening, Hyperlipidemia with target LDL less than 70, Controlled type 2 diabetes mellitus with stage 2 chronic kidney disease, without long-term current use of insulin (HCC), Class 1 obesity due to excess calories with serious comorbidity and body mass index (BMI) of 34.0 to 34.9 in adult, Morbid obesity ([place]), Coronary artery disease of native artery of native  heart with stable angina pectoris ([place]), Hypertensive heart disease without heart failure, Screening for colon cancer, and External hemorrhoid were also pertinent to this visit.  1) T2DM  : lost to follow up for nearly 2 years.  Last OV May 2020.  Last labs were done in 2019.   BS have been 120 to 150 fasting.  Has been having rare hypoglycemia due to skipping meals and taking glipizide.   Post prandials have been under 140. Eating out more,  Not exercising regularly.  Has gained 10-12 lbs  No neuropathy issues  2) CAD:  Has not seen cardiology either in years.  No chest pain.  Discussed change in medication from glipizide to jardiance or invokanna pending review of labs    3) covid exposures, multiple   last November while [place] .  ASYMPTOMATIC,  TESTED NEGATIVE  4) Hemorrhoid?  Getting smaller Using  prep H . Not painful  Just  Difficult to keep clean    History Finch has a past medical history of Coronary artery disease, Erectile dysfunction, Hyperlipidemia, Hypertensive heart disease, Ischemic cardiomyopathy, Morbid obesity ([place]), and Type II diabetes mellitus ([place]).   He has a past surgical history that includes Cardiac catheterization (July 2012) and Gastrostomy w/ feeding tube.   His family history includes Cancer in his father and mother; Diabetes in his mother; Heart disease in his mother; Hypertension in his father.He reports that he has been smoking cigars. He has never used smokeless tobacco. He reports current alcohol use. He reports that he does not use drugs.  Outpatient Medications Prior to Visit  Medication Sig Dispense Refill  . aspirin 81 MG tablet Take 81 mg by mouth daily.    Marland Kitchen atorvastatin (LIPITOR) 40 MG tablet TAKE 1 TABLET BY MOUTH EVERY DAY 90 tablet 1  . clopidogrel (PLAVIX) 75 MG tablet TAKE 1 TABLET BY MOUTH EVERY DAY 90 tablet 1  . furosemide (LASIX) 20 MG tablet TAKE 1 TABLET (20 MG TOTAL) BY MOUTH DAILY. AS NEEDED FOR FLUID RETENTION 90 tablet 1  . glipiZIDE (GLUCOTROL) 10 MG tablet TAKE 1 TABLET (10 MG TOTAL) BY MOUTH 2 (TWO) TIMES DAILY BEFORE A MEAL. 180 tablet 1  . metFORMIN (GLUCOPHAGE) 850 MG tablet TAKE 1 TABLET (850 MG TOTAL) BY MOUTH 2 (TWO) TIMES DAILY WITH A MEAL.(NEED APPT FOR FURTHER REFILLS 180 tablet  1  . metoprolol succinate (TOPROL-XL) 25 MG 24 hr tablet TAKE 1/2 TABLET BY MOUTH DAILY. 45 tablet 3  . nystatin-triamcinolone ointment (MYCOLOG) Apply 1 application topically 2 (two) times daily. 30 g 0  . ONETOUCH VERIO test strip USE AS INSTRUCTED 10 each 2  . ONETOUCH VERIO test strip USE AS INSTRUCTED 100 strip 2  . ONETOUCH VERIO test strip USE AS INSTRUCTED 100 strip 2  . sildenafil (VIAGRA) 100 MG tablet Take 0.5-1 tablets (50-100 mg total) by mouth daily as needed for erectile dysfunction. 5 tablet 11  . telmisartan-hydrochlorothiazide (MICARDIS HCT) 40-12.5 MG tablet TAKE 1 TABLET BY MOUTH EVERYDAY AT BEDTIME 90 tablet 1  . triamcinolone cream (KENALOG) 0.1 % Apply 1 application topically 2 (two) times daily. As needed 30 g 0   No facility-administered medications prior to visit.    Review of Systems   Patient denies headache, fevers, malaise, unintentional weight loss, skin rash, eye pain, sinus congestion and sinus pain, sore throat, dysphagia,  hemoptysis , cough, dyspnea, wheezing, chest pain, palpitations, orthopnea, edema, abdominal pain, nausea, melena, diarrhea, constipation, flank pain, dysuria, hematuria, urinary  Frequency, nocturia, numbness, tingling, seizures,  Focal weakness, Loss of consciousness,  Tremor, insomnia, depression, anxiety, and suicidal ideation.     Objective:  BP 122/78 (BP Location: Left Arm, Patient Position: Sitting)   Pulse 69   Temp 98.5 F (36.9 C)   Ht 5' 10.98" (1.803 m)   Wt 251 lb 3.2 oz (113.9 kg)   SpO2 98%   BMI 35.05 kg/m   Physical Exam  General appearance: alert, cooperative and appears stated age Ears: normal TM's and external ear canals both ears Throat: lips, mucosa, and tongue normal; teeth and gums normal Neck: no adenopathy, no carotid bruit, supple, symmetrical, trachea midline and thyroid not enlarged, symmetric, no tenderness/mass/nodules Back: symmetric, no curvature. ROM normal. No CVA tenderness. Lungs: clear to  auscultation bilaterally Heart: regular rate and rhythm, S1, S2 normal, no murmur, click, rub or gallop Abdomen: soft, non-tender; bowel sounds normal; no masses,  no organomegaly Pulses: 2+ and symmetric Skin: Skin color, texture, turgor normal. No rashes or lesions Rectal: declines exam  Lymph nodes: Cervical, supraclavicular, and axillary nodes normal.  Assessment & Plan:   Problem List Items Addressed This Visit      Unprioritized   Coronary artery disease of native artery of native heart with stable angina pectoris ([place])    He has been asymptomatic, is taking his medications including daily aspirin, ACE inhibitor, beta blocker and statin.  Advised to resume  semi annual follow up with Dr. Rockey Situ.  Lab Results  Component Value Date   CHOL 139 07/02/2020   HDL 46.60 07/02/2020   LDLCALC 59 07/02/2020   LDLDIRECT 54.0 02/21/2017   TRIG 169.0 (H) 07/02/2020   CHOLHDL 3 07/02/2020              DM (diabetes mellitus) type II controlled with renal manifestation (HCC)    Well-controlled on current medications, despite being lost to follow up since 2020 and gaining weight/.    Patient has history of  minimal  microalbuminuria. Patient is tolerating statin therapy for CAD risk reduction and on ACE/ARB for renal protection and hypertension .  I have recommended  stopping glipizide and adding SLGT 2 inhibitor given history of CAD; awaiting patient's decision  ;  Lab Results  Component Value Date   HGBA1C 6.7 (H) 07/02/2020   Lab Results  Component Value Date   MICROALBUR 6.0 (H) 07/02/2020           Relevant Orders   Hemoglobin A1c (Completed)   Microalbumin / creatinine urine ratio (Completed)   Comprehensive metabolic panel (Completed)   Encounter for preventive health examination - Primary    age appropriate education and counseling updated, referrals for preventative services and immunizations addressed, dietary and smoking counseling addressed, most recent labs  reviewed.  I have personally reviewed and have noted:  1) the patient's medical and social history 2) The pt's use of alcohol, tobacco, and illicit drugs 3) The patient's current medications and supplements 4) Functional ability including ADL's, fall risk, home safety risk, hearing and visual impairment 5) Diet and physical activities 6) Evidence for depression or mood disorder 7) The patient's height, weight, and BMI have been recorded in the chart  I have made referrals, and provided counseling and education based on review of the above      External hemorrhoid    Presumed by histroy.  He has declined a rectal exam.  Reminded that  All hemorrhoid treatments are now available OTC.  Advised to Start using a stool softener (  docusate 100 mg )  if needed,  And he  can use anusol HC or any hemorrhoid  cream that has pramoxine in it will help as well to reduce swelling        Hyperlipidemia with target LDL less than 70    LDL is at goal on atorvastatin 40 mg daily   Lab Results  Component Value Date   CHOL 139 07/02/2020   HDL 46.60 07/02/2020   LDLCALC 59 07/02/2020   LDLDIRECT 54.0 02/21/2017   TRIG 169.0 (H) 07/02/2020   CHOLHDL 3 07/02/2020   Lab Results  Component Value Date   ALT 27 07/02/2020   AST 15 07/02/2020   ALKPHOS 69 07/02/2020   BILITOT 0.9 07/02/2020        Relevant Orders   Lipid panel (Completed)   Hypertensive heart disease    BP is Well controlled on current regimen which includes telmisartan 40 mg  ,  Metoprolol 25 mg  And diuretic, Renal function stable, no changes today.  Lab Results  Component Value Date   CREATININE 1.08 07/02/2020   Lab Results  Component Value Date   NA 139 07/02/2020   K 4.4 07/02/2020   CL 103 07/02/2020   CO2 27 07/02/2020         Obesity with serious comorbidity    With diabetes, hypertension and CAD.  He has gained weight due to pandemic restrictions on exercise facilities and being lost to follow up .   I have  addressed  BMI and recommended wt loss of 10% of body weight over the next 6 months using a low glycemic index diet and regular exercise a minimum of 5 days per week.         Other Visit Diagnoses    Prostate cancer screening       Relevant Orders   PSA (Completed)   Morbid obesity ([place])   (Chronic)     Screening for colon cancer       Relevant Orders   Cologuard      I am having Loyal Gambler. Denning "Gideon Starcher" maintain his aspirin, sildenafil, furosemide, OneTouch Verio, OneTouch Verio, triamcinolone, OneTouch Verio, metFORMIN, clopidogrel, atorvastatin, metoprolol succinate, telmisartan-hydrochlorothiazide, glipiZIDE, and nystatin-triamcinolone ointment.  No orders of the defined types were placed in this encounter.   There are no discontinued medications.  Follow-up: No follow-ups on file.   Crecencio Mc, MD

## 2020-07-04 ENCOUNTER — Encounter: Payer: Self-pay | Admitting: Internal Medicine

## 2020-07-04 DIAGNOSIS — K644 Residual hemorrhoidal skin tags: Secondary | ICD-10-CM | POA: Insufficient documentation

## 2020-07-04 NOTE — Assessment & Plan Note (Signed)

## 2020-07-04 NOTE — Assessment & Plan Note (Signed)
Presumed by histroy.  He has declined a rectal exam.  Reminded that  All hemorrhoid treatments are now available OTC.  Advised to Start using a stool softener (docusate 100 mg )  if needed,  And he  can use anusol HC or any hemorrhoid  cream that has pramoxine in it will help as well to reduce swelling

## 2020-07-04 NOTE — Assessment & Plan Note (Signed)
LDL is at goal on atorvastatin 40 mg daily   Lab Results  Component Value Date   CHOL 139 07/02/2020   HDL 46.60 07/02/2020   LDLCALC 59 07/02/2020   LDLDIRECT 54.0 02/21/2017   TRIG 169.0 (H) 07/02/2020   CHOLHDL 3 07/02/2020   Lab Results  Component Value Date   ALT 27 07/02/2020   AST 15 07/02/2020   ALKPHOS 69 07/02/2020   BILITOT 0.9 07/02/2020

## 2020-07-04 NOTE — Assessment & Plan Note (Signed)
With diabetes, hypertension and CAD.  He has gained weight due to pandemic restrictions on exercise facilities and being lost to follow up .   I have addressed  BMI and recommended wt loss of 10% of body weight over the next 6 months using a low glycemic index diet and regular exercise a minimum of 5 days per week.

## 2020-07-04 NOTE — Assessment & Plan Note (Signed)
BP is Well controlled on current regimen which includes telmisartan 40 mg  ,  Metoprolol 25 mg  And diuretic, Renal function stable, no changes today.  Lab Results  Component Value Date   CREATININE 1.08 07/02/2020   Lab Results  Component Value Date   NA 139 07/02/2020   K 4.4 07/02/2020   CL 103 07/02/2020   CO2 27 07/02/2020

## 2020-07-04 NOTE — Assessment & Plan Note (Addendum)
Well-controlled on current medications, despite being lost to follow up since 2020 and gaining weight/.    Patient has history of  minimal  microalbuminuria. Patient is tolerating statin therapy for CAD risk reduction and on ACE/ARB for renal protection and hypertension .  I have recommended  stopping glipizide and adding SLGT 2 inhibitor given history of CAD; awaiting patient's decision  ;  Lab Results  Component Value Date   HGBA1C 6.7 (H) 07/02/2020   Lab Results  Component Value Date   MICROALBUR 6.0 (H) 07/02/2020

## 2020-07-04 NOTE — Assessment & Plan Note (Signed)
He has been asymptomatic, is taking his medications including daily aspirin, ACE inhibitor, beta blocker and statin.  Advised to resume  semi annual follow up with Dr. Rockey Situ.  Lab Results  Component Value Date   CHOL 139 07/02/2020   HDL 46.60 07/02/2020   LDLCALC 59 07/02/2020   LDLDIRECT 54.0 02/21/2017   TRIG 169.0 (H) 07/02/2020   CHOLHDL 3 07/02/2020

## 2020-07-10 ENCOUNTER — Telehealth: Payer: Self-pay | Admitting: Cardiovascular Disease

## 2020-07-10 NOTE — Telephone Encounter (Signed)
3 attempts to schedule fu appt from recall list.   Deleting recall.   

## 2020-09-11 ENCOUNTER — Other Ambulatory Visit: Payer: Self-pay | Admitting: Internal Medicine

## 2020-09-29 ENCOUNTER — Other Ambulatory Visit: Payer: Self-pay | Admitting: Internal Medicine

## 2020-12-11 ENCOUNTER — Other Ambulatory Visit: Payer: Self-pay | Admitting: Internal Medicine

## 2021-03-20 ENCOUNTER — Other Ambulatory Visit: Payer: Self-pay | Admitting: Internal Medicine

## 2021-03-25 ENCOUNTER — Other Ambulatory Visit: Payer: Self-pay | Admitting: Internal Medicine

## 2021-05-16 ENCOUNTER — Other Ambulatory Visit: Payer: Self-pay | Admitting: Internal Medicine

## 2021-06-15 ENCOUNTER — Other Ambulatory Visit: Payer: Self-pay | Admitting: Internal Medicine

## 2021-06-29 ENCOUNTER — Other Ambulatory Visit: Payer: Self-pay | Admitting: Internal Medicine

## 2021-07-16 ENCOUNTER — Other Ambulatory Visit: Payer: Self-pay | Admitting: Internal Medicine

## 2021-07-26 ENCOUNTER — Other Ambulatory Visit: Payer: Self-pay | Admitting: Internal Medicine

## 2021-09-06 ENCOUNTER — Other Ambulatory Visit: Payer: Self-pay | Admitting: Internal Medicine

## 2021-09-07 ENCOUNTER — Other Ambulatory Visit: Payer: Self-pay | Admitting: Internal Medicine

## 2021-09-07 NOTE — Telephone Encounter (Signed)
Pt need [facility]ill on [facility] [place]. [facility] is [facility] there ?[id number] [place], [place]  ?

## 2021-09-08 NOTE — Telephone Encounter (Signed)
Spoke with pt and he stated that he is going to find a lab in [place] that can do his blood work and then schedule a virtual visit with Dr. Derrel Nip before he runs out of his medication in 2 weeks.  ?

## 2021-09-08 NOTE — Telephone Encounter (Signed)
Refilled: 07/16/2021 ?Last OV: 07/02/2020 ?Next OV: not scheduled ?

## 2021-09-23 ENCOUNTER — Telehealth: Payer: Self-pay | Admitting: Internal Medicine

## 2021-09-23 DIAGNOSIS — E1129 Type 2 diabetes mellitus with other diabetic kidney complication: Secondary | ICD-10-CM

## 2021-09-23 DIAGNOSIS — Z125 Encounter for screening for malignant neoplasm of prostate: Secondary | ICD-10-CM

## 2021-09-23 DIAGNOSIS — Z79899 Other long term (current) drug therapy: Secondary | ICD-10-CM

## 2021-09-23 DIAGNOSIS — E785 Hyperlipidemia, unspecified: Secondary | ICD-10-CM

## 2021-09-23 NOTE — Telephone Encounter (Signed)
Spoke with pt and [place] is currently out of state but needs medication refills. He was advised that he would need to have lab work done prior to getting any refills. Pt has found out that there is a [facility] draw station where is he located. I have placed orders for an A1c, lipid panel, CMP, microalbumin, PSA, TSH, and CBC. Is there anything else that needs to be ordered?  ?

## 2021-09-23 NOTE — Telephone Encounter (Signed)
Pt called in stating that he spoke with Janett Billow last week about lab orders being placed... Pt stated that he is temp relocated out of [place]... Pt stated that he does have a [facility] around were he is.. Pt stated that Dr. Derrel Nip is requesting lab orders before she refill one of his medication... Pt requesting callback  ?

## 2021-09-27 ENCOUNTER — Other Ambulatory Visit: Payer: Self-pay | Admitting: Internal Medicine

## 2021-09-27 MED ORDER — GLIPIZIDE 10 MG PO TABS: 10.0000 mg | ORAL_TABLET | Freq: Two times a day (BID) | ORAL | 0 refills | Status: DC

## 2021-09-27 NOTE — Telephone Encounter (Signed)
Done.  [facility] ? ?

## 2021-09-27 NOTE — Addendum Note (Signed)
Addended by: Adair Laundry on: 09/27/2021 05:01 PM ? ? Modules accepted: Orders ? ?

## 2021-09-27 NOTE — Addendum Note (Signed)
Addended by: Crecencio Mc on: 09/27/2021 03:19 PM ? ? Modules accepted: Orders ? ?

## 2021-09-27 NOTE — Telephone Encounter (Signed)
[facility] [place] [facility] request.  ?

## 2021-09-27 NOTE — Telephone Encounter (Signed)
Pt will be going to get his labs done next week and has scheduled a visit with you on 10/11/2021. Pt is currently out of his glipizide and would like to see if he could get a one month supply until his appt.  ?

## 2021-10-01 LAB — HEMOGLOBIN A1C
Est. average glucose Bld gHb Est-mCnc: 160 mg/dL
Hgb A1c MFr Bld: 7.2 % — ABNORMAL HIGH (ref 4.8–5.6)

## 2021-10-01 LAB — CBC WITH DIFFERENTIAL/PLATELET
Basophils Absolute: 0.1 10*3/uL (ref 0.0–0.2)
Basos: 1 %
EOS (ABSOLUTE): 0.4 10*3/uL (ref 0.0–0.4)
Eos: 5 %
Hematocrit: 41.9 % (ref 37.5–51.0)
Hemoglobin: 14.2 g/dL (ref 13.0–17.7)
Immature Grans (Abs): 0 10*3/uL (ref 0.0–0.1)
Immature Granulocytes: 0 %
Lymphocytes Absolute: 2.1 10*3/uL (ref 0.7–3.1)
Lymphs: 30 %
MCH: 29.3 pg (ref 26.6–33.0)
MCHC: 33.9 g/dL (ref 31.5–35.7)
MCV: 86 fL (ref 79–97)
Monocytes Absolute: 0.5 10*3/uL (ref 0.1–0.9)
Monocytes: 8 %
Neutrophils Absolute: 3.9 10*3/uL (ref 1.4–7.0)
Neutrophils: 56 %
Platelets: 239 10*3/uL (ref 150–450)
RBC: 4.85 x10E6/uL (ref 4.14–5.80)
RDW: 13.1 % (ref 11.6–15.4)
WBC: 7 10*3/uL (ref 3.4–10.8)

## 2021-10-01 LAB — COMPREHENSIVE METABOLIC PANEL
ALT: 20 IU/L (ref 0–44)
AST: 15 IU/L (ref 0–40)
Albumin/Globulin Ratio: 2 (ref 1.2–2.2)
Albumin: 4.3 g/dL (ref 3.8–4.8)
Alkaline Phosphatase: 68 IU/L (ref 44–121)
BUN/Creatinine Ratio: 22 (ref 10–24)
BUN: 24 mg/dL (ref 8–27)
Bilirubin Total: 0.6 mg/dL (ref 0.0–1.2)
CO2: 24 mmol/L (ref 20–29)
Calcium: 9.7 mg/dL (ref 8.6–10.2)
Chloride: 104 mmol/L (ref 96–106)
Creatinine, Ser: 1.07 mg/dL (ref 0.76–1.27)
Globulin, Total: 2.1 g/dL (ref 1.5–4.5)
Glucose: 151 mg/dL — ABNORMAL HIGH (ref 70–99)
Potassium: 4.5 mmol/L (ref 3.5–5.2)
Sodium: 143 mmol/L (ref 134–144)
Total Protein: 6.4 g/dL (ref 6.0–8.5)
eGFR: 79 mL/min/{1.73_m2} (ref >59)

## 2021-10-01 LAB — LIPID PANEL
Chol/HDL Ratio: 2.8 ratio (ref 0.0–5.0)
Cholesterol, Total: 133 mg/dL (ref 100–199)
HDL: 47 mg/dL (ref >39)
LDL Chol Calc (NIH): 59 mg/dL (ref 0–99)
Triglycerides: 158 mg/dL — ABNORMAL HIGH (ref 0–149)
VLDL Cholesterol Cal: 27 mg/dL (ref 5–40)

## 2021-10-01 LAB — MICROALBUMIN / CREATININE URINE RATIO
Creatinine, Urine: 162.3 mg/dL
Microalb/Creat Ratio: 15 mg/g creat (ref 0–29)
Microalbumin, Urine: 24.4 ug/mL

## 2021-10-01 LAB — PSA: Prostate Specific Ag, Serum: 0.6 ng/mL (ref 0.0–4.0)

## 2021-10-01 LAB — TSH: TSH: 4.68 u[IU]/mL — ABNORMAL HIGH (ref 0.450–4.500)

## 2021-10-11 ENCOUNTER — Telehealth (INDEPENDENT_AMBULATORY_CARE_PROVIDER_SITE_OTHER): Payer: No Typology Code available for payment source | Admitting: Internal Medicine

## 2021-10-11 ENCOUNTER — Encounter: Payer: Self-pay | Admitting: Internal Medicine

## 2021-10-11 VITALS — Ht 70.0 in | Wt 235.0 lb

## 2021-10-11 DIAGNOSIS — Z125 Encounter for screening for malignant neoplasm of prostate: Secondary | ICD-10-CM | POA: Diagnosis not present

## 2021-10-11 DIAGNOSIS — E785 Hyperlipidemia, unspecified: Secondary | ICD-10-CM

## 2021-10-11 DIAGNOSIS — E1129 Type 2 diabetes mellitus with other diabetic kidney complication: Secondary | ICD-10-CM | POA: Diagnosis not present

## 2021-10-11 DIAGNOSIS — Z1211 Encounter for screening for malignant neoplasm of colon: Secondary | ICD-10-CM

## 2021-10-11 DIAGNOSIS — I119 Hypertensive heart disease without heart failure: Secondary | ICD-10-CM

## 2021-10-11 DIAGNOSIS — R809 Proteinuria, unspecified: Secondary | ICD-10-CM

## 2021-10-11 MED ORDER — DAPAGLIFLOZIN PROPANEDIOL 5 MG PO TABS: 5.0000 mg | ORAL_TABLET | Freq: Every day | ORAL | 2 refills | Status: DC

## 2021-10-11 NOTE — Progress Notes (Signed)
Virtual Visit via CAREGILITY Note ? ?This visit type was conducted due to national recommendations for restrictions regarding the COVID-19 pandemic (e.g. social distancing).  This format is felt to be most appropriate for this patient at this time.  All issues noted in this document were discussed and addressed.  No physical exam was performed (except for noted visual exam findings with Video Visits).  ? ?I connected withNAME@ on 10/11/21 at  9:00 AM EDT by a video enabled telemedicine application and verified that I am speaking with the correct person using two identifiers. ?Location patient: home ?Location provider: work or home office ?Persons participating in the virtual visit: patient, provider ? ?I discussed the limitations, risks, security and privacy concerns of performing an evaluation and management service by telephone and the availability of in person appointments. I also discussed with the patient that there may be a patient responsible charge related to this service. The patient expressed understanding and agreed to proceed. ? ?Reason for visit: DIABETES FOLLOW UP ? ?HPI: ? ?61 YR OLD MALE WITH TYPE 2 DM,  CAD,  HYPERTENSION presents for virtual follow up  due to temporary relocation to [place] for work ? ?1) Ischemic cardiomyopathy: he denies a recent history of chest pain, exertional dyspnea and fluid retention.  Has not used furosemide in months .Marland Kitchen  Last cardiology visit reviewed,  last ECHO reviewed.  Discussed adding SGLT 2 inhibitoirs; risks and benefits.  He is receptive ? ?2) HTN:  does not own a BP monitor.  Taking telmisartan hctz snd metoprolol without side effects.  Recent labs reviewed ? ?3) T2DM:  checking BS fasting only.  No recent lows.  90 to 110 .  Reviewed a1c done last month  elevation due to restaurant eatign during relocation.  Now in an apartment and eating better.  Taking glipizide and metformin as directed.  Denies claudicatio, numbness and tingling   ? ? ? ? ?ROS: See pertinent  positives and negatives per HPI. ? ?Past Medical History:  ?Diagnosis Date  ? Coronary artery disease   ? a. 11/2010 NSTEMI s/p PCI/DES to 99 % LAD (3.0x18 Xience).  ? Erectile dysfunction   ? Hyperlipidemia   ? Hypertensive heart disease   ? Ischemic cardiomyopathy   ? a. 11/2010 LV gram: EF 30-35%; b. 11/2010 Echo: EF 40-45%, impaired LV relaxation, mod-sev Apical AK, nl RV fxn.  ? Morbid obesity ([place])   ? Type II diabetes mellitus ([place])   ? ? ?Past Surgical History:  ?Procedure Laterality Date  ? CARDIAC CATHETERIZATION  July 2012  ? GASTROSTOMY W/ FEEDING TUBE    ? at birth.,  premature 1 of 3 triplets   ? ? ?Family History  ?Problem Relation Age of Onset  ? Cancer Mother   ?     liver  ? Diabetes Mother   ? Heart disease Mother   ? Cancer Father   ?     colon  ? Hypertension Father   ? ? ?SOCIAL HX:  reports that he has quit smoking. His smoking use included cigars. He has never used smokeless tobacco. He reports current alcohol use. He reports that he does not use drugs.  ? ?Current Outpatient Medications:  ?  aspirin 81 MG tablet, Take 81 mg by mouth daily., Disp: , Rfl:  ?  atorvastatin (LIPITOR) 40 MG tablet, TAKE 1 TABLET BY MOUTH EVERY DAY, Disp: 90 tablet, Rfl: 1 ?  clopidogrel (PLAVIX) 75 MG tablet, TAKE 1 TABLET BY MOUTH EVERY DAY, Disp: 90  tablet, Rfl: 1 ?  dapagliflozin propanediol (FARXIGA) 5 MG TABS tablet, Take 1 tablet (5 mg total) by mouth daily before breakfast., Disp: 30 tablet, Rfl: 2 ?  glipiZIDE (GLUCOTROL) 10 MG tablet, TAKE 1 TABLET (10 MG TOTAL) BY MOUTH TWICE A DAY BEFORE A MEAL, Disp: 180 tablet, Rfl: 0 ?  glucose blood (ONETOUCH VERIO) test strip, Used to check blood sugars twice daily., Disp: 100 strip, Rfl: 5 ?  metFORMIN (GLUCOPHAGE) 850 MG tablet, TAKE 1 TABLET (850 MG TOTAL) BY MOUTH 2 (TWO) TIMES DAILY WITH A MEAL.(NEED APPT FOR FURTHER REFILLS, Disp: 180 tablet, Rfl: 1 ?  metoprolol succinate (TOPROL-XL) 25 MG 24 hr tablet, TAKE 1/2 TABLET BY MOUTH DAILY., Disp: 45 tablet, Rfl:  3 ?  nystatin-triamcinolone ointment (MYCOLOG), Apply 1 application topically 2 (two) times daily., Disp: 30 g, Rfl: 0 ?  ONETOUCH VERIO test strip, USE AS INSTRUCTED, Disp: 100 strip, Rfl: 2 ?  sildenafil (VIAGRA) 100 MG tablet, Take 0.5-1 tablets (50-100 mg total) by mouth daily as needed for erectile dysfunction., Disp: 5 tablet, Rfl: 11 ?  telmisartan-hydrochlorothiazide (MICARDIS HCT) 40-12.5 MG tablet, TAKE 1 TABLET BY MOUTH EVERYDAY AT BEDTIME, Disp: 90 tablet, Rfl: 1 ?  triamcinolone cream (KENALOG) 0.1 %, Apply 1 application topically 2 (two) times daily. As needed, Disp: 30 g, Rfl: 0 ? ?EXAM: ? ?VITALS per patient if applicable: ? ?GENERAL: alert, oriented, appears well and in no acute distress ? ?HEENT: atraumatic, conjunttiva clear, no obvious abnormalities on inspection of external nose and ears ? ?NECK: normal movements of the head and neck ? ?LUNGS: on inspection no signs of respiratory distress, breathing rate appears normal, no obvious gross SOB, gasping or wheezing ? ?CV: no obvious cyanosis ? ?MS: moves all visible extremities without noticeable abnormality ? ?PSYCH/NEURO: pleasant and cooperative, no obvious depression or anxiety, speech and thought processing grossly intact ? ?ASSESSMENT AND PLAN: ? ?Discussed the following assessment and plan: ? ?Colon cancer screening - Plan: Cologuard ? ?Controlled type 2 diabetes mellitus with microalbuminuria, without long-term current use of insulin ([place]) ? ?Hyperlipidemia with target LDL less than 70 ? ?Prostate cancer screening ? ?Hypertensive heart disease without heart failure ? ?DM (diabetes mellitus) type II controlled with renal manifestation ([place]) ?Current readings are 95 to 105 fasting .  Discussed adding farxiga and stopping glipizide . Need for post prandial monitoring discussed.  May need to resume '5mg'$  glipizide with dinner only .  Foot exam done virtually today and sensation done by proxy ? ?Lab Results  ?Component Value Date  ? HGBA1C 7.2 (H)  09/30/2021  ? ?Lab Results  ?Component Value Date  ? LABMICR 24.4 09/30/2021  ? MICROALBUR 6.0 (H) 07/02/2020  ? MICROALBUR 0.6 02/21/2018  ? ? ? ? ? ?Hyperlipidemia with target LDL less than 70 ?LDL is at goal on atorvastatin 40 mg daily ? ? ?Lab Results  ?Component Value Date  ? CHOL 133 09/30/2021  ? HDL 47 09/30/2021  ? LDLCALC 59 09/30/2021  ? LDLDIRECT 54.0 02/21/2017  ? TRIG 158 (H) 09/30/2021  ? CHOLHDL 2.8 09/30/2021  ? ?Lab Results  ?Component Value Date  ? ALT 20 09/30/2021  ? AST 15 09/30/2021  ? ALKPHOS 68 09/30/2021  ? BILITOT 0.6 09/30/2021  ? ? ?Colon cancer screening ?Last colonoscopy was normal in 2012.  Sending rx for Cologuard  ? ?Prostate cancer screening ?PSA is normal and unchanged ? ?Lab Results  ?Component Value Date  ? PSA1 0.6 09/30/2021  ? PSA 0.47 07/02/2020  ?  PSA 0.47 02/21/2018  ? PSA 0.51 02/21/2017  ? ? ? ? ?Hypertensive heart disease ?encouarged to check BP at home with consumer Omnron model.  Continue metoprolol and telmisartan hct  ? ?Lab Results  ?Component Value Date  ? CREATININE 1.07 09/30/2021  ? ?Lab Results  ?Component Value Date  ? NA 143 09/30/2021  ? K 4.5 09/30/2021  ? CL 104 09/30/2021  ? CO2 24 09/30/2021  ? ? ?  ?I discussed the assessment and treatment plan with the patient. The patient was provided an opportunity to ask questions and all were answered. The patient agreed with the plan and demonstrated an understanding of the instructions. ?  ?The patient was advised to call back or seek an in-person evaluation if the symptoms worsen or if the condition fails to improve as anticipated. ? ? ?I spent 30 minutes dedicated to the care of this patient on the date of this encounter to include pre-visit review of his medical history,  including his last cardiology evaluation and ECHO, his home BS readings.  Face-to-face time with the patient , his most recent screening labs  and post visit ordering of testing and therapeutics.  ? ? ?Crecencio Mc, MD   ?

## 2021-10-11 NOTE — Assessment & Plan Note (Signed)
encouarged to check BP at home with consumer Omnron model.  Continue metoprolol and telmisartan hct  ? ?Lab Results  ?Component Value Date  ? CREATININE 1.07 09/30/2021  ? ?Lab Results  ?Component Value Date  ? NA 143 09/30/2021  ? K 4.5 09/30/2021  ? CL 104 09/30/2021  ? CO2 24 09/30/2021  ? ? ?

## 2021-10-11 NOTE — Assessment & Plan Note (Signed)
LDL is at goal on atorvastatin 40 mg daily ? ? ?Lab Results  ?Component Value Date  ? CHOL 133 09/30/2021  ? HDL 47 09/30/2021  ? LDLCALC 59 09/30/2021  ? LDLDIRECT 54.0 02/21/2017  ? TRIG 158 (H) 09/30/2021  ? CHOLHDL 2.8 09/30/2021  ? ?Lab Results  ?Component Value Date  ? ALT 20 09/30/2021  ? AST 15 09/30/2021  ? ALKPHOS 68 09/30/2021  ? BILITOT 0.6 09/30/2021  ? ?

## 2021-10-11 NOTE — Assessment & Plan Note (Signed)
PSA is normal and unchanged ? ?Lab Results  ?Component Value Date  ? PSA1 0.6 09/30/2021  ? PSA 0.47 07/02/2020  ? PSA 0.47 02/21/2018  ? PSA 0.51 02/21/2017  ? ? ? ?

## 2021-10-11 NOTE — Assessment & Plan Note (Addendum)
Last colonoscopy was normal in 2012.  Sending rx for Cologuard  ?

## 2021-10-11 NOTE — Patient Instructions (Signed)
Please start checking your blood pressure at home.  Goal Blood pressure  is 130/80 or less  ? ?Goal for post prandial sugars in 160 or less ( 2 hours after meal);  fasting sugar 125 or less (not below 80)  ? ? ?Once you  are able to start farxiga,  we will stop glipizide and follow readings  ? ? ?

## 2021-10-11 NOTE — Assessment & Plan Note (Addendum)
Current readings are 95 to 105 fasting .  Discussed adding farxiga and stopping glipizide . Need for post prandial monitoring discussed.  May need to resume '5mg'$  glipizide with dinner only .  Foot exam done virtually today and sensation done by proxy ? ?Lab Results  ?Component Value Date  ? HGBA1C 7.2 (H) 09/30/2021  ? ?Lab Results  ?Component Value Date  ? LABMICR 24.4 09/30/2021  ? MICROALBUR 6.0 (H) 07/02/2020  ? MICROALBUR 0.6 02/21/2018  ? ? ? ? ?

## 2021-12-25 ENCOUNTER — Other Ambulatory Visit: Payer: Self-pay | Admitting: Internal Medicine

## 2022-01-11 ENCOUNTER — Other Ambulatory Visit: Payer: Self-pay | Admitting: Internal Medicine

## 2022-02-21 ENCOUNTER — Other Ambulatory Visit: Payer: Self-pay | Admitting: Internal Medicine

## 2022-02-25 ENCOUNTER — Other Ambulatory Visit: Payer: Self-pay | Admitting: Internal Medicine

## 2022-03-23 ENCOUNTER — Other Ambulatory Visit: Payer: Self-pay | Admitting: Internal Medicine

## 2022-05-06 ENCOUNTER — Other Ambulatory Visit: Payer: Self-pay

## 2022-05-06 ENCOUNTER — Other Ambulatory Visit: Payer: Self-pay | Admitting: Internal Medicine

## 2022-05-06 MED ORDER — GLIPIZIDE 10 MG PO TABS: ORAL_TABLET | ORAL | 0 refills | Status: DC

## 2022-05-09 ENCOUNTER — Other Ambulatory Visit: Payer: Self-pay | Admitting: Internal Medicine

## 2022-06-02 ENCOUNTER — Other Ambulatory Visit: Payer: Self-pay | Admitting: Internal Medicine

## 2022-08-02 ENCOUNTER — Other Ambulatory Visit: Payer: Self-pay | Admitting: Internal Medicine

## 2022-08-03 ENCOUNTER — Other Ambulatory Visit: Payer: Self-pay | Admitting: Internal Medicine

## 2022-08-21 ENCOUNTER — Other Ambulatory Visit: Payer: Self-pay | Admitting: Internal Medicine

## 2022-08-25 ENCOUNTER — Other Ambulatory Visit: Payer: Self-pay | Admitting: Internal Medicine

## 2022-08-29 ENCOUNTER — Other Ambulatory Visit: Payer: Self-pay | Admitting: Internal Medicine

## 2022-09-07 ENCOUNTER — Other Ambulatory Visit: Payer: Self-pay | Admitting: Internal Medicine

## 2022-09-27 ENCOUNTER — Other Ambulatory Visit: Payer: Self-pay | Admitting: Internal Medicine

## 2022-10-25 ENCOUNTER — Other Ambulatory Visit: Payer: Self-pay | Admitting: Internal Medicine

## 2022-10-25 NOTE — Telephone Encounter (Signed)
LMTCB. Needs to schedule an appt. Not been seen since 10/11/2021.

## 2022-10-28 ENCOUNTER — Telehealth: Payer: Self-pay | Admitting: Internal Medicine

## 2022-10-28 DIAGNOSIS — E785 Hyperlipidemia, unspecified: Secondary | ICD-10-CM

## 2022-10-28 DIAGNOSIS — Z125 Encounter for screening for malignant neoplasm of prostate: Secondary | ICD-10-CM

## 2022-10-28 DIAGNOSIS — Z79899 Other long term (current) drug therapy: Secondary | ICD-10-CM

## 2022-10-28 DIAGNOSIS — E1129 Type 2 diabetes mellitus with other diabetic kidney complication: Secondary | ICD-10-CM

## 2022-10-31 NOTE — Telephone Encounter (Signed)
Pt returned Todd Jenkins CMA call. Note below was read to him. Pt its booked for 12/21/22 @9am  virtual visit. Pt also mentioned that he would like to have blood work done, orders need to be in.

## 2022-10-31 NOTE — Telephone Encounter (Signed)
LMTCB. Pt is over due for follow up. Will need to schedule a follow up before any refills.

## 2022-11-01 NOTE — Telephone Encounter (Signed)
Is it okay to refill until his appt in July. Pt has not been seen in over a year nor had lab in over a year.

## 2022-11-01 NOTE — Telephone Encounter (Signed)
Meds have been refilled, labs have been ordered and pt is aware.

## 2022-11-01 NOTE — Addendum Note (Signed)
Addended by: Sandy Salaam on: 11/01/2022 04:39 PM   Modules accepted: Orders

## 2022-11-23 ENCOUNTER — Other Ambulatory Visit: Payer: Self-pay | Admitting: Internal Medicine

## 2022-11-27 ENCOUNTER — Other Ambulatory Visit: Payer: Self-pay | Admitting: Internal Medicine

## 2022-12-04 ENCOUNTER — Other Ambulatory Visit: Payer: Self-pay | Admitting: Internal Medicine

## 2022-12-08 LAB — COMPREHENSIVE METABOLIC PANEL
ALT: 19 IU/L (ref 0–44)
AST: 12 IU/L (ref 0–40)
Albumin: 4 g/dL (ref 3.9–4.9)
Alkaline Phosphatase: 70 IU/L (ref 44–121)
BUN/Creatinine Ratio: 19 (ref 10–24)
BUN: 20 mg/dL (ref 8–27)
Bilirubin Total: 0.7 mg/dL (ref 0.0–1.2)
CO2: 21 mmol/L (ref 20–29)
Calcium: 9.1 mg/dL (ref 8.6–10.2)
Chloride: 101 mmol/L (ref 96–106)
Creatinine, Ser: 1.06 mg/dL (ref 0.76–1.27)
Globulin, Total: 2.1 g/dL (ref 1.5–4.5)
Glucose: 150 mg/dL — ABNORMAL HIGH (ref 70–99)
Potassium: 4.4 mmol/L (ref 3.5–5.2)
Sodium: 140 mmol/L (ref 134–144)
Total Protein: 6.1 g/dL (ref 6.0–8.5)
eGFR: 79 mL/min/{1.73_m2} (ref >59)

## 2022-12-08 LAB — CBC WITH DIFFERENTIAL/PLATELET
Basophils Absolute: 0 10*3/uL (ref 0.0–0.2)
Basos: 1 %
EOS (ABSOLUTE): 0.2 10*3/uL (ref 0.0–0.4)
Eos: 2 %
Hematocrit: 40.3 % (ref 37.5–51.0)
Hemoglobin: 13.9 g/dL (ref 13.0–17.7)
Immature Grans (Abs): 0 10*3/uL (ref 0.0–0.1)
Immature Granulocytes: 0 %
Lymphocytes Absolute: 2.2 10*3/uL (ref 0.7–3.1)
Lymphs: 30 %
MCH: 29.6 pg (ref 26.6–33.0)
MCHC: 34.5 g/dL (ref 31.5–35.7)
MCV: 86 fL (ref 79–97)
Monocytes Absolute: 0.7 10*3/uL (ref 0.1–0.9)
Monocytes: 10 %
Neutrophils Absolute: 4.1 10*3/uL (ref 1.4–7.0)
Neutrophils: 57 %
Platelets: 213 10*3/uL (ref 150–450)
RBC: 4.7 x10E6/uL (ref 4.14–5.80)
RDW: 13.4 % (ref 11.6–15.4)
WBC: 7.2 10*3/uL (ref 3.4–10.8)

## 2022-12-08 LAB — MICROALBUMIN / CREATININE URINE RATIO
Creatinine, Urine: 105.4 mg/dL
Microalb/Creat Ratio: 51 mg/g creat — ABNORMAL HIGH (ref 0–29)
Microalbumin, Urine: 54 ug/mL

## 2022-12-08 LAB — LIPID PANEL
Chol/HDL Ratio: 2.8 ratio (ref 0.0–5.0)
Cholesterol, Total: 141 mg/dL (ref 100–199)
HDL: 51 mg/dL (ref >39)
LDL Chol Calc (NIH): 64 mg/dL (ref 0–99)
Triglycerides: 150 mg/dL — ABNORMAL HIGH (ref 0–149)
VLDL Cholesterol Cal: 26 mg/dL (ref 5–40)

## 2022-12-08 LAB — LDL CHOLESTEROL, DIRECT: LDL Direct: 61 mg/dL (ref 0–99)

## 2022-12-08 LAB — TSH: TSH: 4.13 u[IU]/mL (ref 0.450–4.500)

## 2022-12-08 LAB — PSA: Prostate Specific Ag, Serum: 0.7 ng/mL (ref 0.0–4.0)

## 2022-12-08 LAB — HEMOGLOBIN A1C
Est. average glucose Bld gHb Est-mCnc: 146 mg/dL
Hgb A1c MFr Bld: 6.7 % — ABNORMAL HIGH (ref 4.8–5.6)

## 2022-12-21 ENCOUNTER — Telehealth: Payer: No Typology Code available for payment source | Admitting: Internal Medicine

## 2022-12-21 ENCOUNTER — Encounter: Payer: Self-pay | Admitting: Internal Medicine

## 2022-12-21 VITALS — BP 118/69 | HR 58 | Ht 70.0 in | Wt 231.0 lb

## 2022-12-21 DIAGNOSIS — E1129 Type 2 diabetes mellitus with other diabetic kidney complication: Secondary | ICD-10-CM

## 2022-12-21 DIAGNOSIS — I119 Hypertensive heart disease without heart failure: Secondary | ICD-10-CM | POA: Diagnosis not present

## 2022-12-21 DIAGNOSIS — Z1211 Encounter for screening for malignant neoplasm of colon: Secondary | ICD-10-CM | POA: Diagnosis not present

## 2022-12-21 DIAGNOSIS — R809 Proteinuria, unspecified: Secondary | ICD-10-CM | POA: Diagnosis not present

## 2022-12-21 MED ORDER — TELMISARTAN 40 MG PO TABS: 40.0000 mg | ORAL_TABLET | Freq: Every day | ORAL | 1 refills | Status: DC

## 2022-12-21 MED ORDER — GLIPIZIDE 10 MG PO TABS: 10.0000 mg | ORAL_TABLET | Freq: Every day | ORAL | 1 refills | Status: DC

## 2022-12-21 MED ORDER — METOPROLOL SUCCINATE ER 25 MG PO TB24: 12.5000 mg | ORAL_TABLET | Freq: Every day | ORAL | 1 refills | Status: DC

## 2022-12-21 MED ORDER — ATORVASTATIN CALCIUM 40 MG PO TABS: 40.0000 mg | ORAL_TABLET | Freq: Every day | ORAL | 3 refills | Status: DC

## 2022-12-21 MED ORDER — CLOPIDOGREL BISULFATE 75 MG PO TABS: 75.0000 mg | ORAL_TABLET | Freq: Every day | ORAL | 1 refills | Status: DC

## 2022-12-21 MED ORDER — TELMISARTAN-HCTZ 40-12.5 MG PO TABS: 1.0000 | ORAL_TABLET | Freq: Every day | ORAL | 1 refills | Status: DC

## 2022-12-21 MED ORDER — METFORMIN HCL 850 MG PO TABS: 850.0000 mg | ORAL_TABLET | Freq: Two times a day (BID) | ORAL | 1 refills | Status: DC

## 2022-12-21 NOTE — Assessment & Plan Note (Addendum)
He has been monitoring his BP at home with consumer Omnron model.  Continue metoprolol and telmisartan .  Stopping hct due to recurrent nocturnal muscle cramps   Lab Results  Component Value Date   CREATININE 1.06 12/07/2022   Lab Results  Component Value Date   NA 140 12/07/2022   K 4.4 12/07/2022   CL 101 12/07/2022   CO2 21 12/07/2022

## 2022-12-21 NOTE — Patient Instructions (Signed)
Change your glipizide to BEFORE EATING DINNER.  It may be bottoming you out later in the evening the way you are taking it.    The hydrochlorothiazide may be causing your muscle cramps   I have changed your BP medication to telmisartan (without hydrochlorothiazide)  Dill pickle juice ,  1 ounce is a GREAT preventive of muscle cramps   If blood pressure is not 130/80 or less on telmisartan alone, let me know   COMING SOON:  COLOGUARD!!!!  JUST DO IT!!! (Please and thank you)  GO GET YOUR TDAP VACCINE

## 2022-12-21 NOTE — Assessment & Plan Note (Signed)
He has not completed the cologuard that was ordered in 2022.  Reordering today

## 2022-12-21 NOTE — Assessment & Plan Note (Signed)
Currently well-controlled on current medications .  foot exam is normal today.  Patient is tolerating statin therapy for CAD risk reduction and on ACE/ARB for renal protection and hypertension.  He reports   Occasional early morning lows likely due to glipizide administration AFTER dinner ,  or due to vigoruous exercise.  Advised to take glipizde BEFORE Dinner and reduce dose to 5 mg if hypoglycemia recurs.  Continue telmisartan and atorvastatin and farxiga.  Eye exam scheduled.   Lab Results  Component Value Date   HGBA1C 6.7 (H) 12/07/2022   Lab Results  Component Value Date   LABMICR 54.0 12/07/2022   LABMICR 24.4 09/30/2021   MICROALBUR 6.0 (H) 07/02/2020   MICROALBUR 0.6 02/21/2018

## 2022-12-21 NOTE — Progress Notes (Signed)
Virtual Visit via Caregility   Note   This format is felt to be most appropriate for this patient at this time.  All issues noted in this document were discussed and addressed.  No physical exam was performed (except for noted visual exam findings with Video Visits).   I connected with Todd Jenkins on 12/21/22 at  9:00 AM EDT by a video enabled telemedicine application and verified that I am speaking with the correct person using two identifiers. Location patient: home Location provider: work or home office Persons participating in the virtual visit: patient, provider  I discussed the limitations, risks, security and privacy concerns of performing an evaluation and management service by telephone and the availability of in person appointments. I also discussed with the patient that there may be a patient responsible charge related to this service. The patient expressed understanding and agreed to proceed.   Reason for visit: diabetes follow up  HPI:  62 yr old male with type 2 DM with microalbuminuria, CAD  and hypertension presents for 6 month follow up.  Patient currently in [place] on business    1) Type 2 DM:  He feels generally well, is exercising several times per week and checking blood sugars once daily at variable times.  BS have been under 130 fasting and < 150 post prandially.  Havig occasional nocturnal hypoglyemic events.  Taking metformin and Farxiga  as directed.  Taking glipizide 10 mg after dinner.  . Following a carbohydrate modified diet 6 days per week. Denies numbness, burning and tingling of extremities. Appetite is good.   2) CAD:  he remains asymptomatic and physically active.   3) HTN:  Hypertension: patient checks blood pressure twice weekly at home.  Readings have been <130/80 at rest . Patient is following a reduced salt diet most days and is taking medications as prescribed : telmisartan/hct at night.  Having frequent leg cramps and foot cramps      ROS: See pertinent  positives and negatives per HPI.  Past Medical History:  Diagnosis Date   Coronary artery disease    a. 11/2010 NSTEMI s/p PCI/DES to 99 % LAD (3.0x18 Xience).   Erectile dysfunction    Hyperlipidemia    Hypertensive heart disease    Ischemic cardiomyopathy    a. 11/2010 LV gram: EF 30-35%; b. 11/2010 Echo: EF 40-45%, impaired LV relaxation, mod-sev Apical AK, nl RV fxn.   Morbid obesity ([facility])    Type II diabetes mellitus [facility])     Past Surgical History:  Procedure Laterality Date   CARDIAC CATHETERIZATION  July 2012   GASTROSTOMY W/ FEEDING TUBE     at birth.,  premature 1 of 3 triplets     Family History  Problem Relation Age of Onset   Cancer Mother        liver   Diabetes Mother    Heart disease Mother    Cancer Father        colon   Hypertension Father     SOCIAL HX:  reports that he has quit smoking. His smoking use included cigars. He has never used smokeless tobacco. He reports current alcohol use. He reports that he does not use drugs.    Current Outpatient Medications:    aspirin 81 MG tablet, Take 81 mg by mouth daily., Disp: , Rfl:    dapagliflozin propanediol (FARXIGA) 5 MG TABS tablet, TAKE 1 TABLET BY MOUTH EVERY DAY BEFORE BREAKFAST, Disp: 90 tablet, Rfl: 1   ONETOUCH VERIO test strip,  USED TO CHECK BLOOD SUGARS TWICE DAILY., Disp: 200 strip, Rfl: 2   telmisartan (MICARDIS) 40 MG tablet, Take 1 tablet (40 mg total) by mouth at bedtime., Disp: 90 tablet, Rfl: 1   triamcinolone cream (KENALOG) 0.1 %, Apply 1 application topically 2 (two) times daily. As needed, Disp: 30 g, Rfl: 0   atorvastatin (LIPITOR) 40 MG tablet, Take 1 tablet (40 mg total) by mouth daily., Disp: 90 tablet, Rfl: 3   clopidogrel (PLAVIX) 75 MG tablet, Take 1 tablet (75 mg total) by mouth daily., Disp: 90 tablet, Rfl: 1   glipiZIDE (GLUCOTROL) 10 MG tablet, Take 1 tablet (10 mg total) by mouth at bedtime., Disp: 90 tablet, Rfl: 1   metFORMIN (GLUCOPHAGE) 850 MG tablet, Take 1 tablet (850 mg  total) by mouth 2 (two) times daily with a meal., Disp: 180 tablet, Rfl: 1   metoprolol succinate (TOPROL-XL) 25 MG 24 hr tablet, Take 0.5 tablets (12.5 mg total) by mouth daily., Disp: 45 tablet, Rfl: 1   sildenafil (VIAGRA) 100 MG tablet, Take 0.5-1 tablets (50-100 mg total) by mouth daily as needed for erectile dysfunction. (Patient not taking: Reported on 12/21/2022), Disp: 5 tablet, Rfl: 11  EXAM:  VITALS per patient if applicable:  GENERAL: alert, oriented, appears well and in no acute distress  HEENT: atraumatic, conjunttiva clear, no obvious abnormalities on inspection of external nose and ears  NECK: normal movements of the head and neck  LUNGS: on inspection no signs of respiratory distress, breathing rate appears normal, no obvious gross SOB, gasping or wheezing  CV: no obvious cyanosis  MS: moves all visible extremities without noticeable abnormality  PSYCH/NEURO: pleasant and cooperative, no obvious depression or anxiety, speech and thought processing grossly intact  ASSESSMENT AND PLAN: Colon cancer screening Assessment & Plan: He has not completed the cologuard that was ordered in 2022.  Reordering today   Orders: -     Cologuard  Controlled type 2 diabetes mellitus with microalbuminuria, without long-term current use of insulin ([facility]) Assessment & Plan: Currently well-controlled on current medications .  foot exam is normal today.  Patient is tolerating statin therapy for CAD risk reduction and on ACE/ARB for renal protection and hypertension.  He reports   Occasional early morning lows likely due to glipizide administration AFTER dinner ,  or due to vigoruous exercise.  Advised to take glipizde BEFORE Dinner and reduce dose to 5 mg if hypoglycemia recurs.  Continue telmisartan and atorvastatin and farxiga.  Eye exam scheduled.   Lab Results  Component Value Date   HGBA1C 6.7 (H) 12/07/2022   Lab Results  Component Value Date   LABMICR 54.0 12/07/2022   LABMICR  24.4 09/30/2021   MICROALBUR 6.0 (H) 07/02/2020   MICROALBUR 0.6 02/21/2018        Hypertensive heart disease without heart failure Assessment & Plan: He has been monitoring his BP at home with consumer Omnron model.  Continue metoprolol and telmisartan .  Stopping hct due to recurrent nocturnal muscle cramps   Lab Results  Component Value Date   CREATININE 1.06 12/07/2022   Lab Results  Component Value Date   NA 140 12/07/2022   K 4.4 12/07/2022   CL 101 12/07/2022   CO2 21 12/07/2022      Other orders -     Atorvastatin Calcium; Take 1 tablet (40 mg total) by mouth daily.  Dispense: 90 tablet; Refill: 3 -     Clopidogrel Bisulfate; Take 1 tablet (75 mg total) by mouth  daily.  Dispense: 90 tablet; Refill: 1 -     glipiZIDE; Take 1 tablet (10 mg total) by mouth at bedtime.  Dispense: 90 tablet; Refill: 1 -     metFORMIN HCl; Take 1 tablet (850 mg total) by mouth 2 (two) times daily with a meal.  Dispense: 180 tablet; Refill: 1 -     Metoprolol Succinate ER; Take 0.5 tablets (12.5 mg total) by mouth daily.  Dispense: 45 tablet; Refill: 1 -     Telmisartan; Take 1 tablet (40 mg total) by mouth at bedtime.  Dispense: 90 tablet; Refill: 1      I discussed the assessment and treatment plan with the patient. The patient was provided an opportunity to ask questions and all were answered. The patient agreed with the plan and demonstrated an understanding of the instructions.   The patient was advised to call back or seek an in-person evaluation if the symptoms worsen or if the condition fails to improve as anticipated.   I spent 30 minutes dedicated to the care of this patient on the date of this encounter to include pre-visit review of his medical history,  Face-to-face time with the patient , and post visit ordering of testing and therapeutics.    Sherlene Shams, MD

## 2022-12-26 ENCOUNTER — Telehealth: Payer: Self-pay | Admitting: Internal Medicine

## 2022-12-26 NOTE — Telephone Encounter (Signed)
Lvm for pt to scheduled his 6 months f/u with provider after MyChart visit on 12/21/22.

## 2023-02-16 LAB — COLOGUARD: COLOGUARD: NEGATIVE

## 2023-05-26 ENCOUNTER — Other Ambulatory Visit: Payer: Self-pay | Admitting: Internal Medicine

## 2023-06-17 ENCOUNTER — Other Ambulatory Visit: Payer: Self-pay | Admitting: Internal Medicine

## 2023-06-20 ENCOUNTER — Other Ambulatory Visit: Payer: Self-pay | Admitting: Internal Medicine

## 2023-07-15 ENCOUNTER — Other Ambulatory Visit: Payer: Self-pay | Admitting: Internal Medicine

## 2023-07-30 ENCOUNTER — Other Ambulatory Visit: Payer: Self-pay | Admitting: Internal Medicine

## 2023-09-11 ENCOUNTER — Other Ambulatory Visit: Payer: Self-pay | Admitting: Internal Medicine

## 2023-09-12 NOTE — Telephone Encounter (Signed)
 LMTCB

## 2023-09-13 NOTE — Telephone Encounter (Signed)
 Pt called back and stated that [place] is [place] out [place] and doesn't know when he will be back to schedule an appt. Is it okay to refill medication?

## 2023-09-16 ENCOUNTER — Other Ambulatory Visit: Payer: Self-pay | Admitting: Internal Medicine

## 2023-09-18 NOTE — Telephone Encounter (Signed)
 Pt is out of the state for work and isn't sure when he will return. He is past due for a follow up appt. Is it okay to refill medications?

## 2023-10-20 ENCOUNTER — Other Ambulatory Visit: Payer: Self-pay | Admitting: Internal Medicine

## 2023-10-20 DIAGNOSIS — I25118 Atherosclerotic heart disease of native coronary artery with other forms of angina pectoris: Secondary | ICD-10-CM

## 2023-10-20 DIAGNOSIS — Z79899 Other long term (current) drug therapy: Secondary | ICD-10-CM

## 2023-10-20 DIAGNOSIS — E785 Hyperlipidemia, unspecified: Secondary | ICD-10-CM

## 2023-10-20 DIAGNOSIS — E1129 Type 2 diabetes mellitus with other diabetic kidney complication: Secondary | ICD-10-CM

## 2023-10-20 DIAGNOSIS — I119 Hypertensive heart disease without heart failure: Secondary | ICD-10-CM

## 2023-11-01 ENCOUNTER — Encounter: Payer: Self-pay | Admitting: Internal Medicine

## 2023-11-01 DIAGNOSIS — E785 Hyperlipidemia, unspecified: Secondary | ICD-10-CM

## 2023-11-01 DIAGNOSIS — Z79899 Other long term (current) drug therapy: Secondary | ICD-10-CM

## 2023-11-01 DIAGNOSIS — R809 Proteinuria, unspecified: Secondary | ICD-10-CM

## 2023-11-02 ENCOUNTER — Other Ambulatory Visit: Payer: Self-pay | Admitting: Internal Medicine

## 2023-11-06 ENCOUNTER — Other Ambulatory Visit: Payer: Self-pay | Admitting: Internal Medicine

## 2023-11-06 MED ORDER — TELMISARTAN 40 MG PO TABS: ORAL_TABLET | ORAL | 0 refills | Status: DC

## 2023-11-06 MED ORDER — CLOPIDOGREL BISULFATE 75 MG PO TABS: 75.0000 mg | ORAL_TABLET | Freq: Every day | ORAL | 0 refills | Status: DC

## 2023-11-06 MED ORDER — GLIPIZIDE 10 MG PO TABS: ORAL_TABLET | ORAL | 0 refills | Status: DC

## 2023-11-06 MED ORDER — DAPAGLIFLOZIN PROPANEDIOL 5 MG PO TABS: ORAL_TABLET | ORAL | 0 refills | Status: DC

## 2023-11-06 MED ORDER — ATORVASTATIN CALCIUM 40 MG PO TABS: 40.0000 mg | ORAL_TABLET | Freq: Every day | ORAL | 0 refills | Status: DC

## 2023-11-06 MED ORDER — METFORMIN HCL 850 MG PO TABS: 850.0000 mg | ORAL_TABLET | Freq: Two times a day (BID) | ORAL | 0 refills | Status: DC

## 2023-11-18 ENCOUNTER — Ambulatory Visit: Payer: Self-pay | Admitting: Internal Medicine

## 2023-11-18 LAB — LIPID PANEL
Chol/HDL Ratio: 3.1 ratio (ref 0.0–5.0)
Cholesterol, Total: 154 mg/dL (ref 100–199)
HDL: 50 mg/dL (ref >39)
LDL Chol Calc (NIH): 75 mg/dL (ref 0–99)
Triglycerides: 171 mg/dL — ABNORMAL HIGH (ref 0–149)
VLDL Cholesterol Cal: 29 mg/dL (ref 5–40)

## 2023-11-18 LAB — CBC WITH DIFFERENTIAL/PLATELET
Basophils Absolute: 0.1 10*3/uL (ref 0.0–0.2)
Basos: 1 %
EOS (ABSOLUTE): 0.2 10*3/uL (ref 0.0–0.4)
Eos: 3 %
Hematocrit: 44.2 % (ref 37.5–51.0)
Hemoglobin: 14.7 g/dL (ref 13.0–17.7)
Immature Grans (Abs): 0 10*3/uL (ref 0.0–0.1)
Immature Granulocytes: 0 %
Lymphocytes Absolute: 2.3 10*3/uL (ref 0.7–3.1)
Lymphs: 32 %
MCH: 29.3 pg (ref 26.6–33.0)
MCHC: 33.3 g/dL (ref 31.5–35.7)
MCV: 88 fL (ref 79–97)
Monocytes Absolute: 0.7 10*3/uL (ref 0.1–0.9)
Monocytes: 10 %
Neutrophils Absolute: 3.9 10*3/uL (ref 1.4–7.0)
Neutrophils: 54 %
Platelets: 204 10*3/uL (ref 150–450)
RBC: 5.02 x10E6/uL (ref 4.14–5.80)
RDW: 13.1 % (ref 11.6–15.4)
WBC: 7.1 10*3/uL (ref 3.4–10.8)

## 2023-11-18 LAB — COMPREHENSIVE METABOLIC PANEL WITH GFR
ALT: 20 IU/L (ref 0–44)
AST: 12 IU/L (ref 0–40)
Albumin: 4.3 g/dL (ref 3.9–4.9)
Alkaline Phosphatase: 82 IU/L (ref 44–121)
BUN/Creatinine Ratio: 17 (ref 10–24)
BUN: 18 mg/dL (ref 8–27)
Bilirubin Total: 0.7 mg/dL (ref 0.0–1.2)
CO2: 21 mmol/L (ref 20–29)
Calcium: 9.3 mg/dL (ref 8.6–10.2)
Chloride: 107 mmol/L — ABNORMAL HIGH (ref 96–106)
Creatinine, Ser: 1.05 mg/dL (ref 0.76–1.27)
Globulin, Total: 2.4 g/dL (ref 1.5–4.5)
Glucose: 146 mg/dL — ABNORMAL HIGH (ref 70–99)
Potassium: 4.5 mmol/L (ref 3.5–5.2)
Sodium: 144 mmol/L (ref 134–144)
Total Protein: 6.7 g/dL (ref 6.0–8.5)
eGFR: 80 mL/min/{1.73_m2} (ref >59)

## 2023-11-18 LAB — LDL CHOLESTEROL, DIRECT: LDL Direct: 67 mg/dL (ref 0–99)

## 2023-11-18 LAB — HEMOGLOBIN A1C
Est. average glucose Bld gHb Est-mCnc: 146 mg/dL
Hgb A1c MFr Bld: 6.7 % — ABNORMAL HIGH (ref 4.8–5.6)

## 2023-11-18 LAB — MICROALBUMIN / CREATININE URINE RATIO
Creatinine, Urine: 89.3 mg/dL
Microalb/Creat Ratio: 79 mg/g{creat} — ABNORMAL HIGH (ref 0–29)
Microalbumin, Urine: 70.5 ug/mL

## 2023-11-18 LAB — TSH: TSH: 4.37 u[IU]/mL (ref 0.450–4.500)

## 2023-11-23 MED ORDER — DAPAGLIFLOZIN PROPANEDIOL 5 MG PO TABS: ORAL_TABLET | ORAL | 0 refills | Status: DC

## 2023-11-23 MED ORDER — TELMISARTAN 40 MG PO TABS: ORAL_TABLET | ORAL | 0 refills | Status: DC

## 2023-11-23 MED ORDER — CLOPIDOGREL BISULFATE 75 MG PO TABS
75.0000 mg | ORAL_TABLET | Freq: Every day | ORAL | 0 refills | Status: DC
Start: 2023-11-23 — End: 2024-03-04

## 2023-11-23 MED ORDER — ATORVASTATIN CALCIUM 40 MG PO TABS: 40.0000 mg | ORAL_TABLET | Freq: Every day | ORAL | 0 refills | Status: DC

## 2023-11-23 MED ORDER — GLIPIZIDE 10 MG PO TABS: ORAL_TABLET | ORAL | 0 refills | Status: DC

## 2023-11-23 MED ORDER — METFORMIN HCL 850 MG PO TABS: 850.0000 mg | ORAL_TABLET | Freq: Two times a day (BID) | ORAL | 0 refills | Status: DC

## 2023-11-23 MED ORDER — METOPROLOL SUCCINATE ER 25 MG PO TB24: 12.5000 mg | ORAL_TABLET | Freq: Every day | ORAL | 0 refills | Status: DC

## 2023-12-21 ENCOUNTER — Other Ambulatory Visit: Payer: Self-pay | Admitting: Internal Medicine

## 2023-12-25 ENCOUNTER — Other Ambulatory Visit: Payer: Self-pay | Admitting: Internal Medicine

## 2024-01-03 ENCOUNTER — Encounter: Admitting: Internal Medicine

## 2024-02-19 ENCOUNTER — Other Ambulatory Visit: Payer: Self-pay | Admitting: Internal Medicine

## 2024-02-20 ENCOUNTER — Other Ambulatory Visit: Payer: Self-pay | Admitting: Internal Medicine

## 2024-02-22 LAB — HM DIABETES EYE EXAM

## 2024-02-29 ENCOUNTER — Other Ambulatory Visit: Payer: Self-pay | Admitting: Internal Medicine

## 2024-03-04 ENCOUNTER — Encounter: Payer: Self-pay | Admitting: Internal Medicine

## 2024-03-04 ENCOUNTER — Ambulatory Visit: Admitting: Internal Medicine

## 2024-03-04 VITALS — BP 120/62 | HR 75 | Temp 98.4°F | Ht 70.0 in | Wt 246.2 lb

## 2024-03-04 DIAGNOSIS — Z0000 Encounter for general adult medical examination without abnormal findings: Secondary | ICD-10-CM

## 2024-03-04 DIAGNOSIS — I25118 Atherosclerotic heart disease of native coronary artery with other forms of angina pectoris: Secondary | ICD-10-CM

## 2024-03-04 DIAGNOSIS — E66811 Obesity, class 1: Secondary | ICD-10-CM

## 2024-03-04 DIAGNOSIS — Z7984 Long term (current) use of oral hypoglycemic drugs: Secondary | ICD-10-CM

## 2024-03-04 DIAGNOSIS — I499 Cardiac arrhythmia, unspecified: Secondary | ICD-10-CM

## 2024-03-04 DIAGNOSIS — E1129 Type 2 diabetes mellitus with other diabetic kidney complication: Secondary | ICD-10-CM | POA: Diagnosis not present

## 2024-03-04 DIAGNOSIS — E6609 Other obesity due to excess calories: Secondary | ICD-10-CM

## 2024-03-04 DIAGNOSIS — E538 Deficiency of other specified B group vitamins: Secondary | ICD-10-CM

## 2024-03-04 DIAGNOSIS — Z125 Encounter for screening for malignant neoplasm of prostate: Secondary | ICD-10-CM

## 2024-03-04 DIAGNOSIS — Z23 Encounter for immunization: Secondary | ICD-10-CM

## 2024-03-04 DIAGNOSIS — Z1211 Encounter for screening for malignant neoplasm of colon: Secondary | ICD-10-CM

## 2024-03-04 DIAGNOSIS — Z6834 Body mass index (BMI) 34.0-34.9, adult: Secondary | ICD-10-CM

## 2024-03-04 DIAGNOSIS — E785 Hyperlipidemia, unspecified: Secondary | ICD-10-CM

## 2024-03-04 MED ORDER — METOPROLOL SUCCINATE ER 25 MG PO TB24: 12.5000 mg | ORAL_TABLET | Freq: Every day | ORAL | 1 refills | Status: AC

## 2024-03-04 MED ORDER — DAPAGLIFLOZIN PROPANEDIOL 5 MG PO TABS: ORAL_TABLET | ORAL | 1 refills | Status: AC

## 2024-03-04 MED ORDER — GLIPIZIDE 10 MG PO TABS: ORAL_TABLET | ORAL | 1 refills | Status: AC

## 2024-03-04 MED ORDER — METFORMIN HCL 850 MG PO TABS: 850.0000 mg | ORAL_TABLET | Freq: Two times a day (BID) | ORAL | 1 refills | Status: AC

## 2024-03-04 MED ORDER — CLOPIDOGREL BISULFATE 75 MG PO TABS: 75.0000 mg | ORAL_TABLET | Freq: Every day | ORAL | 1 refills | Status: AC

## 2024-03-04 MED ORDER — ATORVASTATIN CALCIUM 40 MG PO TABS: 40.0000 mg | ORAL_TABLET | Freq: Every day | ORAL | 1 refills | Status: AC

## 2024-03-04 MED ORDER — TELMISARTAN 40 MG PO TABS: ORAL_TABLET | ORAL | 1 refills | Status: AC

## 2024-03-04 NOTE — Patient Instructions (Addendum)
 You should have a colonoscopy due to your father's history of colon CA. Please send me the name and location of a gastroenterologist in [place] that you would like to be referred to for this  We will repeat the abnormal urine test with your other labs this Fall Vaccines:  TdaP and prevnar 20  (pneumonia )  given today   Get your  flu vaccine when you get back to [facility]

## 2024-03-04 NOTE — Progress Notes (Addendum)
 Patient ID: Todd Jenkins, male    DOB: 20-Mar-1961  Age: 63 y.o. MRN: [id number]  The patient is here for annual preventive examination and management of other chronic and acute problems.   The risk factors are reflected in the social history.   The roster of all physicians providing medical care to patient - is listed in the Snapshot section of the chart.   Activities of daily living:  The patient is 100% independent in all ADLs: dressing, toileting, feeding as well as independent mobility   Home safety : The patient has smoke detectors in the home. They wear seatbelts.  There are no unsecured firearms at home. There is no violence in the home.    There is no risks for hepatitis, STDs or HIV. There is no   history of blood transfusion. They have no travel history to infectious disease endemic areas of the world.   The patient has seen their dentist in the last six month. They have seen their eye doctor in the last year. The patinet  denies slight hearing difficulty with regard to whispered voices and some television programs.  They have deferred audiologic testing in the last year.  They do not  have excessive sun exposure. Discussed the need for sun protection: hats, long sleeves and use of sunscreen if there is significant sun exposure.    Diet: the importance of a healthy diet is discussed. They do have a healthy diet.   The benefits of regular aerobic exercise were discussed. The patient  has not been exercising regularly since his relocation and has gained weight .    Depression screen: there are no signs or vegative symptoms of depression- irritability, change in appetite, anhedonia, sadness/tearfullness.   The following portions of the patient's history were reviewed and updated as appropriate: allergies, current medications, past family history, past medical history,  past surgical history, past social history  and problem list.   Visual acuity was not assessed per patient  preference since the patient has regular follow up with an  ophthalmologist. Hearing and body mass index were assessed and reviewed.    During the course of the visit the patient was educated and counseled about appropriate screening and preventive services including : fall prevention , diabetes screening, nutrition counseling, colorectal cancer screening, and recommended immunizations.    Chief Complaint:  Patient has been lost to follow up on type 2 D  hypertension obesity and CAD   Review of Symptoms  Patient denies headache, fevers, malaise, unintentional weight loss, skin rash, eye pain, sinus congestion and sinus pain, sore throat, dysphagia,  hemoptysis , cough, dyspnea, wheezing, chest pain, palpitations, orthopnea, edema, abdominal pain, nausea, melena, diarrhea, constipation, flank pain, dysuria, hematuria, urinary  Frequency, nocturia, numbness, tingling, seizures,  Focal weakness, Loss of consciousness,  Tremor, insomnia, depression, anxiety, and suicidal ideation.    Physical Exam:  BP 120/62   Pulse 75   Temp 98.4 F (36.9 C) (Oral)   Ht 5' 10 (1.778 m)   Wt 246 lb 3.2 oz (111.7 kg)   SpO2 97%   BMI 35.33 kg/m    Physical Exam Vitals reviewed.  Constitutional:      General: He is not in acute distress.    Appearance: Normal appearance. He is obese. He is not ill-appearing, toxic-appearing or diaphoretic.  HENT:     Head: Normocephalic and atraumatic.     Right Ear: Tympanic membrane, ear canal and external ear normal. There is no impacted cerumen.  Left Ear: Tympanic membrane, ear canal and external ear normal. There is no impacted cerumen.     Nose: Nose normal.     Mouth/Throat:     Mouth: Mucous membranes are moist.     Pharynx: Oropharynx is clear.  Eyes:     General: No scleral icterus.       Right eye: No discharge.        Left eye: No discharge.     Conjunctiva/sclera: Conjunctivae normal.  Neck:     Thyroid : No thyromegaly.     Vascular: No  carotid bruit or JVD.  Cardiovascular:     Rate and Rhythm: Normal rate. Rhythm irregular.     Heart sounds: Normal heart sounds.  Pulmonary:     Effort: Pulmonary effort is normal. No respiratory distress.     Breath sounds: Normal breath sounds.  Abdominal:     General: Bowel sounds are normal.     Palpations: Abdomen is soft. There is no mass.     Tenderness: There is no abdominal tenderness. There is no guarding or rebound.  Musculoskeletal:        General: Normal range of motion.     Cervical back: Normal range of motion and neck supple.  Lymphadenopathy:     Cervical: No cervical adenopathy.  Skin:    General: Skin is warm and dry.  Neurological:     General: No focal deficit present.     Mental Status: He is alert and oriented to person, place, and time. Mental status is at baseline.  Psychiatric:        Mood and Affect: Mood normal.        Behavior: Behavior normal.        Thought Content: Thought content normal.        Judgment: Judgment normal.      Assessment and Plan: DM (diabetes mellitus) type II controlled with renal manifestation (HCC) Assessment & Plan: Currently well-controlled on glipizide  metformin  and Farxiga .  .  foot exam is normal today.    .  Continue telmisartan  and atorvastatin  and farxiga .  Eye exam is up to date  Lab Results  Component Value Date   HGBA1C 6.7 (H) 11/17/2023   Lab Results  Component Value Date   LABMICR 70.5 11/17/2023   LABMICR 54.0 12/07/2022   MICROALBUR 0.6 02/21/2018       Orders: -     Hemoglobin A1c -     Comprehensive metabolic panel with GFR  Hyperlipidemia with target LDL less than 70 -     Lipid panel -     LDL cholesterol, direct  Cardiac arrhythmia, unspecified cardiac arrhythmia type Assessment & Plan: I have ordered and reviewed a 12 lead EKG and find that there are no acute changes and patient is in sinus rhythm.     Orders: -     EKG 12-Lead -     Magnesium  Prostate cancer screening -      PSA  Need for pneumococcal 20-valent conjugate vaccination -     Pneumococcal conjugate vaccine 20-valent  Need for Tdap vaccination -     Tdap vaccine greater than or equal to 7yo IM  Class 1 obesity due to excess calories with serious comorbidity and body mass index (BMI) of 34.0 to 34.9 in adult Assessment & Plan: Complicated by DM and HTN .  Weight gain noted.  Will discuss use of Ozempic or Mounjaro at next visit if he is unable to lose weight by  then.   He has no contraindiciations,  and has a history of CAD      Coronary artery disease of native artery of native heart with stable angina pectoris Assessment & Plan: He has been asymptomatic, is taking his medications including daily aspirin, ACE inhibitor, beta blocker, SGLT 2 inhibitor  and statin.  Since his relocation to [facility] he has not had cardiology follow up   Lab Results  Component Value Date   CHOL 154 11/17/2023   HDL 50 11/17/2023   LDLCALC 75 11/17/2023   LDLDIRECT 67 11/17/2023   TRIG 171 (H) 11/17/2023   CHOLHDL 3.1 11/17/2023           Colon cancer screening Assessment & Plan: He  completed the cologuard in 2023, but has a paternal history of colon CA.  Strongly advised him to allow me to refer him for colonoscopy    B12 deficiency Assessment & Plan: Responding to oral supplements.  Io changes today  Lab Results  Component Value Date   VITAMINB12 1,435 (H) 02/21/2018      Encounter for preventive health examination Assessment & Plan: age appropriate education and counseling updated, referrals for preventative services and immunizations addressed, dietary and smoking counseling addressed, most recent labs reviewed.  I have personally reviewed and have noted:   1) the patient's medical and social history 2) The pt's use of alcohol, tobacco, and illicit drugs 3) The patient's current medications and supplements 4) Functional ability including ADL's, fall risk, home safety risk, hearing  and visual impairment 5) Diet and physical activities 6) Evidence for depression or mood disorder 7) The patient's height, weight, and BMI have been recorded in the chart 8) I have ordered and reviewed a 12 lead EKG and find that there are no acute changes and patient is in sinus rhythm.     I have made referrals, and provided counseling and education based on review of the above    Other orders -     Atorvastatin  Calcium ; Take 1 tablet (40 mg total) by mouth daily.  Dispense: 90 tablet; Refill: 1 -     Clopidogrel  Bisulfate; Take 1 tablet (75 mg total) by mouth daily.  Dispense: 90 tablet; Refill: 1 -     Dapagliflozin  Propanediol; TAKE 1 TABLET BY MOUTH EVERY DAY BEFORE BREAKFAST  Dispense: 90 tablet; Refill: 1 -     glipiZIDE ; TAKE 1 TABLET BY MOUTH EVERYDAY AT BEDTIME  Dispense: 90 tablet; Refill: 1 -     metFORMIN  HCl; Take 1 tablet (850 mg total) by mouth 2 (two) times daily with a meal.  Dispense: 180 tablet; Refill: 1 -     Metoprolol  Succinate ER; Take 0.5 tablets (12.5 mg total) by mouth daily.  Dispense: 45 tablet; Refill: 1 -     Telmisartan ; TAKE 1 TABLET BY MOUTH EVERYDAY AT BEDTIME  Dispense: 90 tablet; Refill: 1    Return in about 6 months (around 09/02/2024) for follow up diabetes.  Verneita LITTIE Kettering, MD

## 2024-03-05 DIAGNOSIS — I499 Cardiac arrhythmia, unspecified: Secondary | ICD-10-CM | POA: Insufficient documentation

## 2024-03-05 NOTE — Assessment & Plan Note (Signed)
 He  completed the cologuard in 2023, but has a paternal history of colon CA.  Strongly advised him to allow me to refer him for colonoscopy

## 2024-03-05 NOTE — Assessment & Plan Note (Signed)
 Responding to oral supplements.  Io changes today  Lab Results  Component Value Date   VITAMINB12 1,435 (H) 02/21/2018

## 2024-03-05 NOTE — Assessment & Plan Note (Signed)
 Currently well-controlled on glipizide  metformin  and Farxiga .  .  foot exam is normal today.    .  Continue telmisartan  and atorvastatin  and farxiga .  Eye exam is up to date  Lab Results  Component Value Date   HGBA1C 6.7 (H) 11/17/2023   Lab Results  Component Value Date   LABMICR 70.5 11/17/2023   LABMICR 54.0 12/07/2022   MICROALBUR 0.6 02/21/2018

## 2024-03-05 NOTE — Assessment & Plan Note (Signed)
 Complicated by DM and HTN .  Weight gain noted.  Will discuss use of Ozempic or Mounjaro at next visit if he is unable to lose weight by then.   He has no contraindiciations,  and has a history of CAD

## 2024-03-05 NOTE — Assessment & Plan Note (Signed)
 He has been asymptomatic, is taking his medications including daily aspirin, ACE inhibitor, beta blocker, SGLT 2 inhibitor  and statin.  Since his relocation to [facility] he has not had cardiology follow up   Lab Results  Component Value Date   CHOL 154 11/17/2023   HDL 50 11/17/2023   LDLCALC 75 11/17/2023   LDLDIRECT 67 11/17/2023   TRIG 171 (H) 11/17/2023   CHOLHDL 3.1 11/17/2023

## 2024-03-05 NOTE — Assessment & Plan Note (Signed)
I have ordered and reviewed a 12 lead EKG and find that there are no acute changes and patient is in sinus rhythm.

## 2024-03-19 ENCOUNTER — Ambulatory Visit: Payer: Self-pay | Admitting: Internal Medicine

## 2024-03-19 LAB — MAGNESIUM: Magnesium: 1.9 mg/dL (ref 1.6–2.3)

## 2024-03-19 LAB — LIPID PANEL
Chol/HDL Ratio: 2.7 ratio (ref 0.0–5.0)
Cholesterol, Total: 142 mg/dL (ref 100–199)
HDL: 52 mg/dL (ref >39)
LDL Chol Calc (NIH): 65 mg/dL (ref 0–99)
Triglycerides: 149 mg/dL (ref 0–149)
VLDL Cholesterol Cal: 25 mg/dL (ref 5–40)

## 2024-03-19 LAB — COMPREHENSIVE METABOLIC PANEL WITH GFR
ALT: 20 IU/L (ref 0–44)
AST: 13 IU/L (ref 0–40)
Albumin: 4 g/dL (ref 3.9–4.9)
Alkaline Phosphatase: 79 IU/L (ref 47–123)
BUN/Creatinine Ratio: 17 (ref 10–24)
BUN: 17 mg/dL (ref 8–27)
Bilirubin Total: 0.7 mg/dL (ref 0.0–1.2)
CO2: 22 mmol/L (ref 20–29)
Calcium: 9.5 mg/dL (ref 8.6–10.2)
Chloride: 106 mmol/L (ref 96–106)
Creatinine, Ser: 1.03 mg/dL (ref 0.76–1.27)
Globulin, Total: 2.2 g/dL (ref 1.5–4.5)
Glucose: 146 mg/dL — ABNORMAL HIGH (ref 70–99)
Potassium: 4.5 mmol/L (ref 3.5–5.2)
Sodium: 143 mmol/L (ref 134–144)
Total Protein: 6.2 g/dL (ref 6.0–8.5)
eGFR: 81 mL/min/1.73 (ref >59)

## 2024-03-19 LAB — PSA: Prostate Specific Ag, Serum: 0.8 ng/mL (ref 0.0–4.0)

## 2024-03-19 LAB — HEMOGLOBIN A1C
Est. average glucose Bld gHb Est-mCnc: 146 mg/dL
Hgb A1c MFr Bld: 6.7 % — ABNORMAL HIGH (ref 4.8–5.6)

## 2024-03-19 LAB — LDL CHOLESTEROL, DIRECT: LDL Direct: 57 mg/dL (ref 0–99)

## 2024-03-26 NOTE — Addendum Note (Signed)
 Addended by: MARYLYNN VERNEITA CROME on: 03/26/2024 06:44 AM   Modules accepted: Level of Service

## 2024-03-26 NOTE — Assessment & Plan Note (Signed)

## 2024-07-02 ENCOUNTER — Encounter: Payer: Self-pay | Admitting: Internal Medicine

## 2024-07-05 MED ORDER — ONETOUCH VERIO VI STRP: ORAL_STRIP | 2 refills | Status: AC

## 2024-07-05 NOTE — Addendum Note (Signed)
 Addended by: HARRIETTE RAISIN on: 07/05/2024 05:26 PM   Modules accepted: Orders
